# Patient Record
Sex: Male | Born: 1980 | Race: White | Hispanic: No | Marital: Single | Smoking: Current every day smoker
Health system: Southern US, Community
[De-identification: ages and names within clinical notes are randomized; demographics above are authoritative.]

---

## 2003-08-05 ENCOUNTER — Ambulatory Visit (HOSPITAL_COMMUNITY): Admission: RE | Admit: 2003-08-05 | Discharge: 2003-08-05 | Payer: Self-pay | Admitting: Family Medicine

## 2003-08-05 ENCOUNTER — Encounter: Payer: Self-pay | Admitting: Family Medicine

## 2007-03-31 ENCOUNTER — Emergency Department (HOSPITAL_COMMUNITY): Admission: EM | Admit: 2007-03-31 | Discharge: 2007-04-01 | Payer: Self-pay | Admitting: Emergency Medicine

## 2007-06-28 ENCOUNTER — Emergency Department (HOSPITAL_COMMUNITY): Admission: EM | Admit: 2007-06-28 | Discharge: 2007-06-28 | Payer: Self-pay | Admitting: Emergency Medicine

## 2007-11-04 ENCOUNTER — Emergency Department (HOSPITAL_COMMUNITY): Admission: EM | Admit: 2007-11-04 | Discharge: 2007-11-04 | Payer: Self-pay | Admitting: Emergency Medicine

## 2008-12-28 ENCOUNTER — Ambulatory Visit: Payer: Self-pay | Admitting: Cardiology

## 2008-12-28 ENCOUNTER — Ambulatory Visit: Payer: Self-pay | Admitting: Oncology

## 2008-12-28 ENCOUNTER — Inpatient Hospital Stay (HOSPITAL_COMMUNITY): Admission: EM | Admit: 2008-12-28 | Discharge: 2009-01-04 | Payer: Self-pay | Admitting: Internal Medicine

## 2008-12-28 ENCOUNTER — Ambulatory Visit: Payer: Self-pay | Admitting: Emergency Medicine

## 2008-12-29 ENCOUNTER — Encounter: Payer: Self-pay | Admitting: Emergency Medicine

## 2008-12-29 ENCOUNTER — Ambulatory Visit: Payer: Self-pay | Admitting: Infectious Diseases

## 2011-04-02 LAB — GC/CHLAMYDIA PROBE AMP, URINE: Chlamydia, Swab/Urine, PCR: NEGATIVE

## 2011-04-02 LAB — CBC
HCT: 29.2 % — ABNORMAL LOW (ref 39.0–52.0)
HCT: 37.4 % — ABNORMAL LOW (ref 39.0–52.0)
HCT: 29.2 % — ABNORMAL LOW (ref 39.0–52.0)
HCT: 31.3 % — ABNORMAL LOW (ref 39.0–52.0)
Hemoglobin: 10.1 g/dL — ABNORMAL LOW (ref 13.0–17.0)
Hemoglobin: 9.9 g/dL — ABNORMAL LOW (ref 13.0–17.0)
Hemoglobin: 10.9 g/dL — ABNORMAL LOW (ref 13.0–17.0)
Hemoglobin: 9.9 g/dL — ABNORMAL LOW (ref 13.0–17.0)
MCHC: 33.4 g/dL (ref 30.0–36.0)
MCHC: 33.8 g/dL (ref 30.0–36.0)
MCHC: 33.8 g/dL (ref 30.0–36.0)
MCHC: 33.2 g/dL (ref 30.0–36.0)
MCHC: 33.9 g/dL (ref 30.0–36.0)
MCHC: 34.3 g/dL (ref 30.0–36.0)
MCV: 98.1 fL (ref 78.0–100.0)
MCV: 96.7 fL (ref 78.0–100.0)
MCV: 97 fL (ref 78.0–100.0)
MCV: 98.2 fL (ref 78.0–100.0)
MCV: 98.3 fL (ref 78.0–100.0)
Platelets: 20 10*3/uL — CL (ref 150–400)
Platelets: 8 10*3/uL — CL (ref 150–400)
Platelets: 289 10*3/uL (ref 150–400)
RBC: 3.36 MIL/uL — ABNORMAL LOW (ref 4.22–5.81)
RBC: 2.97 MIL/uL — ABNORMAL LOW (ref 4.22–5.81)
RBC: 3.23 MIL/uL — ABNORMAL LOW (ref 4.22–5.81)
RBC: 3.34 MIL/uL — ABNORMAL LOW (ref 4.22–5.81)
RDW: 12.7 % (ref 11.5–15.5)
RDW: 13 % (ref 11.5–15.5)
RDW: 13.2 % (ref 11.5–15.5)
RDW: 13.5 % (ref 11.5–15.5)
RDW: 14.1 % (ref 11.5–15.5)
RDW: 13.7 % (ref 11.5–15.5)
WBC: 10.4 10*3/uL (ref 4.0–10.5)
WBC: 12.9 10*3/uL — ABNORMAL HIGH (ref 4.0–10.5)
WBC: 14.1 10*3/uL — ABNORMAL HIGH (ref 4.0–10.5)
WBC: 15.1 10*3/uL — ABNORMAL HIGH (ref 4.0–10.5)

## 2011-04-02 LAB — GLUCOSE, CAPILLARY
Glucose-Capillary: 107 mg/dL — ABNORMAL HIGH (ref 70–99)
Glucose-Capillary: 130 mg/dL — ABNORMAL HIGH (ref 70–99)
Glucose-Capillary: 161 mg/dL — ABNORMAL HIGH (ref 70–99)
Glucose-Capillary: 80 mg/dL (ref 70–99)
Glucose-Capillary: 87 mg/dL (ref 70–99)
Glucose-Capillary: 89 mg/dL (ref 70–99)

## 2011-04-02 LAB — DIFFERENTIAL
Basophils Relative: 0 % (ref 0–1)
Eosinophils Relative: 0 % (ref 0–5)
Lymphocytes Relative: 1 % — ABNORMAL LOW (ref 12–46)
Lymphocytes Relative: 9 % — ABNORMAL LOW (ref 12–46)
Lymphs Abs: 1.7 10*3/uL (ref 0.7–4.0)
Monocytes Relative: 8 % (ref 3–12)
Neutro Abs: 12 10*3/uL — ABNORMAL HIGH (ref 1.7–7.7)
Neutro Abs: 15.5 10*3/uL — ABNORMAL HIGH (ref 1.7–7.7)
Neutrophils Relative %: 93 % — ABNORMAL HIGH (ref 43–77)
Neutrophils Relative %: 83 % — ABNORMAL HIGH (ref 43–77)

## 2011-04-02 LAB — CARDIAC PANEL(CRET KIN+CKTOT+MB+TROPI)
CK, MB: 2.7 ng/mL (ref 0.3–4.0)
CK, MB: 4.9 ng/mL — ABNORMAL HIGH (ref 0.3–4.0)
CK, MB: 5.5 ng/mL — ABNORMAL HIGH (ref 0.3–4.0)
Relative Index: 1.7 (ref 0.0–2.5)
Total CK: 168 U/L (ref 7–232)
Total CK: 299 U/L — ABNORMAL HIGH (ref 7–232)
Total CK: 321 U/L — ABNORMAL HIGH (ref 7–232)
Troponin I: 0.18 ng/mL — ABNORMAL HIGH (ref 0.00–0.06)

## 2011-04-02 LAB — COMPREHENSIVE METABOLIC PANEL
ALT: 16 U/L (ref 0–53)
ALT: 56 U/L — ABNORMAL HIGH (ref 0–53)
ALT: 103 U/L — ABNORMAL HIGH (ref 0–53)
ALT: 61 U/L — ABNORMAL HIGH (ref 0–53)
AST: 28 U/L (ref 0–37)
Albumin: 2.7 g/dL — ABNORMAL LOW (ref 3.5–5.2)
Alkaline Phosphatase: 56 U/L (ref 39–117)
BUN: 21 mg/dL (ref 6–23)
BUN: 6 mg/dL (ref 6–23)
CO2: 25 mEq/L (ref 19–32)
CO2: 26 mEq/L (ref 19–32)
Calcium: 7.9 mg/dL — ABNORMAL LOW (ref 8.4–10.5)
Calcium: 7.9 mg/dL — ABNORMAL LOW (ref 8.4–10.5)
Calcium: 7.4 mg/dL — ABNORMAL LOW (ref 8.4–10.5)
Calcium: 7.4 mg/dL — ABNORMAL LOW (ref 8.4–10.5)
Chloride: 102 mEq/L (ref 96–112)
Chloride: 106 mEq/L (ref 96–112)
Creatinine, Ser: 1.16 mg/dL (ref 0.4–1.5)
Creatinine, Ser: 0.78 mg/dL (ref 0.4–1.5)
Creatinine, Ser: 0.8 mg/dL (ref 0.4–1.5)
GFR calc Af Amer: >60 mL/min (ref >60)
GFR calc Af Amer: >60 mL/min (ref >60)
GFR calc non Af Amer: >60 mL/min (ref >60)
GFR calc non Af Amer: >60 mL/min (ref >60)
Glucose, Bld: 101 mg/dL — ABNORMAL HIGH (ref 70–99)
Glucose, Bld: 127 mg/dL — ABNORMAL HIGH (ref 70–99)
Glucose, Bld: 120 mg/dL — ABNORMAL HIGH (ref 70–99)
Glucose, Bld: 133 mg/dL — ABNORMAL HIGH (ref 70–99)
Potassium: 3.3 mEq/L — ABNORMAL LOW (ref 3.5–5.1)
Sodium: 134 mEq/L — ABNORMAL LOW (ref 135–145)
Sodium: 144 mEq/L (ref 135–145)
Sodium: 135 mEq/L (ref 135–145)
Sodium: 138 mEq/L (ref 135–145)
Total Bilirubin: 2.1 mg/dL — ABNORMAL HIGH (ref 0.3–1.2)
Total Protein: 4.3 g/dL — ABNORMAL LOW (ref 6.0–8.3)
Total Protein: 5.2 g/dL — ABNORMAL LOW (ref 6.0–8.3)
Total Protein: 5.6 g/dL — ABNORMAL LOW (ref 6.0–8.3)
Total Protein: 4.6 g/dL — ABNORMAL LOW (ref 6.0–8.3)

## 2011-04-02 LAB — BLOOD GAS, ARTERIAL
Acid-base deficit: 4.8 mmol/L — ABNORMAL HIGH (ref 0.0–2.0)
Bicarbonate: 19.9 mEq/L — ABNORMAL LOW (ref 20.0–24.0)
Drawn by: 229971
Drawn by: 308601
FIO2: 0.21 %
FIO2: 0.3 %
MECHVT: 500 mL
MECHVT: 500 mL
O2 Saturation: 98.7 %
PEEP: 5 cmH2O
PEEP: 5 cmH2O
PEEP: 5 cmH2O
Patient temperature: 98.6
Patient temperature: 99
RATE: 12 resp/min
RATE: 14 resp/min
TCO2: 18.7 mmol/L (ref 0–100)
pCO2 arterial: 29.3 mmHg — ABNORMAL LOW (ref 35.0–45.0)
pCO2 arterial: 30.5 mmHg — ABNORMAL LOW (ref 35.0–45.0)
pH, Arterial: 7.392 (ref 7.350–7.450)
pH, Arterial: 7.438 (ref 7.350–7.450)
pH, Arterial: 7.462 — ABNORMAL HIGH (ref 7.350–7.450)
pO2, Arterial: 110 mmHg — ABNORMAL HIGH (ref 80.0–100.0)

## 2011-04-02 LAB — BASIC METABOLIC PANEL
BUN: 19 mg/dL (ref 6–23)
BUN: 10 mg/dL (ref 6–23)
BUN: 6 mg/dL (ref 6–23)
CO2: 19 mEq/L (ref 19–32)
CO2: 25 mEq/L (ref 19–32)
Chloride: 105 mEq/L (ref 96–112)
Chloride: 107 mEq/L (ref 96–112)
Creatinine, Ser: 0.76 mg/dL (ref 0.4–1.5)
GFR calc Af Amer: >60 mL/min (ref >60)
GFR calc Af Amer: >60 mL/min (ref >60)
GFR calc non Af Amer: >60 mL/min (ref >60)
Glucose, Bld: 93 mg/dL (ref 70–99)
Potassium: 3.6 mEq/L (ref 3.5–5.1)
Potassium: 3.3 mEq/L — ABNORMAL LOW (ref 3.5–5.1)
Potassium: 4.1 mEq/L (ref 3.5–5.1)
Sodium: 142 mEq/L (ref 135–145)
Sodium: 138 mEq/L (ref 135–145)

## 2011-04-02 LAB — HIGH SENSITIVITY CRP: CRP, High Sensitivity: 287 mg/L — ABNORMAL HIGH

## 2011-04-02 LAB — LACTIC ACID, PLASMA: Lactic Acid, Venous: 1.5 mmol/L (ref 0.5–2.2)

## 2011-04-02 LAB — PHOSPHORUS
Phosphorus: 1.4 mg/dL — ABNORMAL LOW (ref 2.3–4.6)
Phosphorus: 4.2 mg/dL (ref 2.3–4.6)
Phosphorus: 3.1 mg/dL (ref 2.3–4.6)

## 2011-04-02 LAB — CHLAMYDIA TRACHOMATIS CULTURE

## 2011-04-02 LAB — TSH: TSH: 0.405 u[IU]/mL (ref 0.350–4.500)

## 2011-04-02 LAB — SAVE SMEAR

## 2011-04-02 LAB — URINALYSIS, ROUTINE W REFLEX MICROSCOPIC
Ketones, ur: 40 mg/dL — AB
Nitrite: NEGATIVE
Protein, ur: 100 mg/dL — AB

## 2011-04-02 LAB — DIC (DISSEMINATED INTRAVASCULAR COAGULATION)PANEL
Fibrinogen: 712 mg/dL — ABNORMAL HIGH (ref 204–475)
INR: 1.1 (ref 0.00–1.49)
Smear Review: NONE SEEN
aPTT: 34 seconds (ref 24–37)

## 2011-04-02 LAB — ANA: Anti Nuclear Antibody(ANA): NEGATIVE

## 2011-04-02 LAB — HAPTOGLOBIN: Haptoglobin: 318 mg/dL — ABNORMAL HIGH (ref 16–200)

## 2011-04-02 LAB — HEPATITIS PANEL, ACUTE
HCV Ab: NEGATIVE
Hep B C IgM: NEGATIVE
Hepatitis B Surface Ag: NEGATIVE

## 2011-04-02 LAB — MAGNESIUM
Magnesium: 1.8 mg/dL (ref 1.5–2.5)
Magnesium: 2.7 mg/dL — ABNORMAL HIGH (ref 1.5–2.5)
Magnesium: 1.6 mg/dL (ref 1.5–2.5)
Magnesium: 1.7 mg/dL (ref 1.5–2.5)

## 2011-04-02 LAB — CULTURE, BLOOD (ROUTINE X 2): Culture: NO GROWTH

## 2011-04-02 LAB — VANCOMYCIN, TROUGH: Vancomycin Tr: 23.7 ug/mL — ABNORMAL HIGH (ref 10.0–20.0)

## 2011-04-02 LAB — CULTURE, BAL-QUANTITATIVE W GRAM STAIN: Colony Count: NO GROWTH

## 2011-04-02 LAB — HCV RNA QUANT

## 2011-04-02 LAB — DIRECT ANTIGLOBULIN TEST (NOT AT ARMC)
DAT, IgG: NEGATIVE
DAT, complement: NEGATIVE

## 2011-04-02 LAB — URINE CULTURE: Special Requests: NEGATIVE

## 2011-04-02 LAB — ANTI-NEUTROPHIL ANTIBODY

## 2011-04-02 LAB — SODIUM, URINE, RANDOM: Sodium, Ur: 42 mEq/L
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
## 2011-05-01 NOTE — H&P (Signed)
Derek Hobbs, Derek Hobbs           ACCOUNT NO.:  [id number]   MEDICAL RECORD NO.:  [id number]          PATIENT TYPE:  INP   LOCATION:  1222                         [facility]ACILITY[facility]  [facility]   PHYSICIAN:  Acey Lav, MD  DATE OF BIRTH:  1981/11/17   DATE OF ADMISSION:  12/28/2008  DATE OF DISCHARGE:                              HISTORY & PHYSICAL   CHIEF COMPLAINT:  Fever, nausea, vomiting, profound confusion and  thrombocytopenia.   HISTORY OF PRESENT ILLNESS:  Mr. Derek Hobbs is a 30 year old Caucasian  gentleman with a past medical history significant for abuse of  prescription narcotics who underwent detox 1 year ago who also may  suffer from depression.  He apparently took 3-4 tablets of oxymorphone  with a friend and ingested them Friday night.  It is not clear whether  he snorted these or ate them.  He apparently has no history of  intravenous injection of drugs.  He additionally apparently chewed some  fentanyl patches and ingested them as well.  Saturday during the day, he  awoke with a fever of 103.5 and had severe nausea with vomiting that  persisted throughout the day without hematemesis.  Nausea and vomiting  persisted throughout the day on Saturday and Sunday.  He then began to  develop lower back pain that was 9-10 in severity and persisted through  to Monday in which he then developed headaches and neck pain and neck  stiffness.  He was brought to [facility] by his wife and  after admission became profoundly confused.  Labs done at [facility] showed him to be in acute renal failure with creatinine there  of 3.4 with BUN of 67.  His CBC showed his white count to be slightly  elevated at 11.5, normal hemoglobin but with a platelet count of 27,000.  At [facility], they were concerned that he might have  meningitis, ITP or possibly TTP.  There was consideration for lumbar  puncture, but this was deferred due to his low platelet count.  Overnight, he had a CT scan of the head which was negative, a CT scan of  the abdomen and pelvis which was negative, and a chest x-ray which was  unrevealing.  He had blood cultures drawn today at noon which are not  growing any organisms to date.  He was given a dose of vancomycin 1 g  and a gram of ceftriaxone at [facility].  They repeated  his labs today, and his creatinine had improved with fluids and came  down to 1.9.  His platelets on recheck this morning were 12,000 in a  blue-capped tube.  Arrangements were made with Dr. Flonnie Overman to have the  patient transferred to [facility] as [facility] lacked  hematology/oncology or neurology consultation services.  Upon arrival at  the ICU at [facility], he was profoundly confused, thrashing about.  His exam was notable for erythema and pain in his right elbow as well as  his PIP of his third digit on the right and his left knee.  He also had  lower back pain but without  obvious deformity or abscess.  The patient  is being admitted to [facility] with consultation from  hematology/oncology and Dr. Mancel Bale as well as CCM from Dr. Coralyn Helling.   PAST MEDICAL HISTORY:  1. History of substance abuse with addiction to prescription      narcotics, although he is not currently on prescription narcotics,      and the narcotics he ingested were those of a friend.  2. History of alcohol abuse in the past but not recently per wife.  3. History of depression.  Wife thinks he may have bipolar disorder,      but he has not been formally diagnosed by anyone.   SURGICAL HISTORY:  None.   SOCIAL HISTORY:  The patient is married, has 71-month-old child.  He  smokes tobacco and recently started abusing prescription narcotics  again.   FAMILY HISTORY:  No history of CNS malignancy.   HOME MEDICATIONS:  None.   MEDICATIONS AT TRANSFER:  Vancomycin, Rocephin, Ativan, Haldol,  morphine.   REVIEW OF  SYSTEMS:  Not obtainable from the patient otherwise that what  is described in history of present illness and reviewed with his wife.   PHYSICAL EXAMINATION:  Temperature was 100.5 rectally with a blood  pressure 118/77 with a pulse of 113, respiratory rate was 33, pulse  oximetry 100% on room air.  GENERAL:  The patient is profoundly confused, thrashing about in the  bed.  HEENT:  Is normocephalic, atraumatic.  Pupils were constricted and  minimally reactive to light.  Sclerae anicteric.  Oropharynx is dry  without erythema or exudate.  NECK:  Did not show any evidence of meningismus with the neck flexion  with prompting from his wife.  CARDIOVASCULAR:  Revealed tachycardia without murmurs, gallops or rubs.  LUNGS:  Clear to auscultation bilaterally without wheezing, rhonchi, or  rales.  ABDOMEN:  Was soft, nondistended, nontender without discernible  tenderness.  Positive bowel sounds.  Examination of the patient's joints revealed them to have erythematous  hot right elbow as well as the third DIP on the right.  Additionally,  his left knee was swollen with an effusion, erythematous and painful.  NEUROLOGICAL EXAM:  He was moving all 4 extremities.  He is answering  questions inappropriately.  He does recognize his wife, when prompting  that he is in the hospital, agrees with this.  Otherwise, he makes  inappropriate remarks, is taking off clothes, and offering to buy me  prescription narcotics when he is comprehensible.   LABORATORY DATA:  Admission labs over at [facility]  showed a metabolic panel with a glucose 161, BUN 67, creatinine 3.4.  Sodium 131, potassium 3.0, chloride 89, bicarb 23, glucose 127, amylase  129, initial anion gap of 20, total bilirubin 1.3, direct 0.3, indirect  1, AST and ALT 27 and 16.  CK of 78, CK-MB 1.7, troponin 3.3, lipase 77.  Urinalysis showed specific gravity 1.025, 100 protein, large amounts of  blood, red blood cells, large  amounts of bacteria, 0-5 casts.  Tox  screen positive for opiates but otherwise negative other drugs.  Influenza swab was negative.  Plasma ammonia level was 14 and normal.  Imaging chest x-ray and abdominal series showed no acute pulmonary or  intra-abdominal process.  CT abdomen and pelvis without contrast showed  no pathology in the abdomen or pelvis.  CT without contrast of the head  done yesterday showed no acute intracranial abnormality and reportedly  CT scan today  showed no intracranial abnormality either.  Repeat labs  done today showed that his renal function improved with his creatinine  coming down to 1.9, BUN down to 44, still with elevated bilirubin of  1.8, alkaline phosphatase now down to 62 from 124.  Repeat platelet  count in a blue-topped tube was 12,000 this morning.  Blood cultures  were drawn and have grown no cultures to date; I just checked the lab  this evening.  No urine cultures were done at the other hospital.   ASSESSMENT AND PLAN:  This is a 30 year old Caucasian with a history of  abuse of prescription narcotics who recently ingested oxymorphone and  fentanyl patches apparently by oral route, possibly with nasal route as  well, who presented with acute onset of fever Saturday with nausea and  vomiting for 2 days followed by back pain with ascending pain into the  neck and head and followed by profound confusion.  He was found on labs  to have profound thrombocytopenia, acute renal failure, but not anemia  initially.   1. Neurological:  Differential diagnosis for the patient's acute      neurological compromise includes CNS infection including meningitis      with pneumococcus meningococcus although less likely with the      patient's living conditions.  He could have an aseptic meningitis      with herpes simplex virus.  Certainly, his diffuse polyarticular      arthritis is concerning for polyarticular septic arthritis, and he      may simply have severe  infection with multiple joint involvement      and be confused from sepsis.  Certainly with low platelets, acute      onset of renal failure, and progressive neurological deterioration,      I am concerned about TTP.  For the present, we are going to cover      him broadly with vancomycin for MRSA, ceftriaxone to give him      coverage for disseminated gonococcal disease as well as      meningococcus and pneumococcus.  The vancomycin will also give [place]      coverage for pneumococcus.  I will give acyclovir 10 mg/kg renally      dosed to cover for possible herpes simplex type 1 encephalitis.  LP      is not going to be safe at this time due to the patient's thrashing      and his low platelets.  If he is sedated and intubated, we could do      an LP after being given some platelets.  We would then send this      for a CSF for cell count, differential, protein, glucose, culture,      herpes simplex type 1 and 2, and VZV PCR.  In the interim, I am      working him up for infection broadly with following his cultures at      [facility].  I am checking GC chlamydia from urine and then      GC from mouth and rectum.  2. Profound thrombocytopenia: Differential includes infection with      DIC, ITP, TTP.  Dr. Mancel Bale kindly agreed to come see the      patient and has examined the patient and was dispensable in his      care.  We are sending blood for stat CBC with peripheral blood      smear, LDH, haptoglobin, reticulocyte  count.  I am also checking      HIV antibodies, RNA, hepatitis C RNA and hepatitis panel.  Will      send ANA, ANKA, and sed rates as well.  The patient currently has      one peripheral IV.  Should we need plasmapheresis, we will insert a      central line.  I have also talked to Dr. Coralyn Helling to assist in      management of the patient.  3. Acute renal failure:  Differential includes prerenal failure with      dehydration given his multiple episodes of nausea  and vomiting      versus TTP, HUS.  We will support him with fluids now.  His      creatinine appears to be headed in right direction at this point in      time.  4. Gastrointestinal:  Nausea, vomiting.  This is likely due to an      infection, possibly meningitis, but he certainly could have a      gastroenteritis as well.  He is not having bloody bowel movements.      I will check his stool for white blood cells and for hemoccult.  5. Cardiovascular:  He had a mildly positive troponin with negative CK      and CK-MB.  I will follow his enzymes and recheck his EKG.  His tox      screen was negative for cocaine and amphetamines at the outside      hospital.  6. Polysubstance abuse:  The patient will need counseling once he gets      through his acute episode.  I will also check his blood for an      alcohol level, as certainly alcohol withdrawal should be considered      as well.  7. Agitation:  I will try to manage the patient's agitation with      Haldol and Ativan if needed.  8. Pain.  I will try to minimize use of narcotics for pain but will      use morphine if needed if Tylenol will not relieve his pain.  9. Prophylaxis:  I am going to place the patient on SCDs at this point      in time.  Defer recommendations with regards to anticoagulation to      hematology/oncology given the patient's low platelets.  10.Code:  The patient is a full code.   ADDENDUM: tHE PATIENT WAS FOUND TO HAVE GRAM POSITIVE COCCI IN CLUSTERS  IN BLOOD CULTURES FROM [facility]. THis patient has a  Staphylococcal BACTEREMIA, with dissemination to multiple joints, I fear  given his severe back pain, and his neurological compromise that he  likely has diskitis, possibly an epidural abscess. I am also very  concerned that the pt may have a parameningeal focus of  infection that is seeding his meninges causing meningitis. I have  discussed with Dr. Craige Cotta and we will plan on intubating the patient so   that we can get MRI of brain and spine to evaluate for diskitis,  epidural abscesses, brain abscess. We may very well need Neurosurgical  intervention here.      Acey Lav, MD  Electronically Signed     CV/MEDQ  D:  12/29/2008  T:  12/29/2008  Job:  [id number]   cc:   Leighton Roach. Truett Perna, M.D.  Fax: [phone]   Coralyn Helling, MD  [address]  [place],  [place] [address]

## 2011-05-04 NOTE — Discharge Summary (Signed)
Derek Hobbs, Derek Hobbs           ACCOUNT NO.:  [id number]   MEDICAL RECORD NO.:  [id number]          PATIENT TYPE:  INP   LOCATION:  1514                         [facility]ACILITY[facility]  [facility]   PHYSICIAN:  Theodosia Paling, MD    DATE OF BIRTH:  05-09-1981   DATE OF ADMISSION:  12/28/2008  DATE OF DISCHARGE:  01/04/2009                               DISCHARGE SUMMARY   DISCHARGE DIAGNOSES:  1. Meningeal toxemia.  2. History of alcohol abuse.  3. History of depression.  4. History of polysubstance abuse.  5. Thrombocytopenia.  6. Acute renal insufficiency.  7. Questionable history of depression.   DISCHARGE MEDICATIONS:  None.   HOSPITAL COURSE:  The following issues were addressed during the  hospitalization.  1. Meningeal toxemia.  The patient initially presented with fever,      headache and he was initially taken to [facility]      by his wife.  Initial workup was performed and later on he was      transferred to [facility].  Over there, his blood culture      grew meningococcus.  The patient received ceftriaxone and he      completed a course of antibiotic.  Infectious disease consultation      was performed and Dr. Daiva Eves evaluated the patient for meningeal      toxemia.  Also hematology consult was performed by Dr. Thornton Papas, who felt that everything is secondary to the infection      rather than a hematologic problem because the patient also had      thrombocytopenia.  2. Thrombocytopenia.  The patient initially had a platelet count of      27,000 on admission to [facility] and had a creatinine of      3.4.  After the patient improved with IV antibiotic throughout the      hospitalization, his platelet count improved.  At the time of      discharge his platelet count was within normal range.  He did not      have any acute bleeding or any complications from thrombocytopenia.      At the time of discharge his platelet count has gone from 8  to 7 to      20 to 28 to 80, and at the time of discharge it is 354,000.  Most      likely it was from myelosuppression or secondary to bacteremia.  3. Acute renal insufficiency.  At the time of presentation, the      patient had a creatinine of 3.2.  At time of discharge the      patient's creatinine is 0.76.  He received IV fluids and he did not      develop any acute complications of any electrolyte or volume      disturbances secondary to the renal failure with gentle hydration.      His kidney function improved.   DISCHARGE MEDICATIONS:  None.   CONSULTATION:  1. Performed by Dr. Thornton Papas on December 29, 2008 of hematology  for thrombocytopenia.  2. Dr. Daiva Eves for infectious disease.   PROCEDURES PERFORMED:  None.   IMAGING PERFORMED:  The patient underwent ultrasound of the liver which  showed no acute intra-abdominal pathology.  Chest x-ray done on December 31, 2008 showing no significant change.  Chest x-ray done on December 30, 2008 showing new left lower lobe airspace disease suggestive  atelectasis.  MRI of the cervical, thoracic and lumbar spine did not  show any acute evidence of cystitis or epidural abscess.  Chest x-ray  performed on December 29, 2008 showed no acute infiltrate.   The patient was admitted on January 16 and admitted into intensive care  unit, critical care service, for fever and delirium and he underwent an  intubation at the time of admission.  However, the patient got extubated  almost the next day on the 16th.  The patient also was complaining of  excessive back pain for which MRI was performed, which was negative for  diskitis or osteomyelitis.  Total time spent in discharge of this  patient 1 hour 15 minutes.  The patient does not have a PCP to which  this summary can be copied.      Theodosia Paling, MD  Electronically Signed     NP/MEDQ  D:  02/10/2009  T:  02/10/2009  Job:  [id number]

## 2011-09-25 LAB — DIFFERENTIAL
Basophils Absolute: 0
Basophils Relative: 0
Eosinophils Absolute: 0.1 — ABNORMAL LOW
Eosinophils Relative: 1
Neutrophils Relative %: 78 — ABNORMAL HIGH

## 2011-09-25 LAB — BASIC METABOLIC PANEL
BUN: 9
Chloride: 108
Creatinine, Ser: 0.94
Glucose, Bld: 98
Potassium: 3.8

## 2011-09-25 LAB — CBC
HCT: 43.2
MCHC: 34
MCV: 98.7
Platelets: 270
RDW: 12.7

## 2011-09-25 LAB — RAPID URINE DRUG SCREEN, HOSP PERFORMED
Amphetamines: NOT DETECTED
Barbiturates: NOT DETECTED

## 2011-10-02 LAB — BASIC METABOLIC PANEL
BUN: 7
CO2: 26
Calcium: 10.2
Glucose, Bld: 107 — ABNORMAL HIGH
Sodium: 141

## 2011-10-02 LAB — RAPID URINE DRUG SCREEN, HOSP PERFORMED
Amphetamines: NOT DETECTED
Barbiturates: NOT DETECTED
Benzodiazepines: NOT DETECTED
Cocaine: NOT DETECTED
Opiates: POSITIVE — AB

## 2011-10-02 LAB — DIFFERENTIAL
Eosinophils Relative: 0
Lymphocytes Relative: 7 — ABNORMAL LOW
Lymphs Abs: 0.9
Monocytes Relative: 6

## 2011-10-02 LAB — CBC
HCT: 46.7
Hemoglobin: 16
Platelets: 242
RBC: 4.9
WBC: 12.5 — ABNORMAL HIGH

## 2015-09-18 ENCOUNTER — Inpatient Hospital Stay (HOSPITAL_COMMUNITY)
Admission: EM | Admit: 2015-09-18 | Discharge: 2015-09-29 | DRG: 987 | Disposition: A | Discharge status: Home / Self Care | Payer: Self-pay | Attending: Surgery | Admitting: Surgery

## 2015-09-18 ENCOUNTER — Emergency Department (HOSPITAL_COMMUNITY): Payer: Self-pay

## 2015-09-18 ENCOUNTER — Emergency Department (HOSPITAL_COMMUNITY): Payer: MEDICAID

## 2015-09-18 ENCOUNTER — Encounter (HOSPITAL_COMMUNITY): Payer: Self-pay

## 2015-09-18 DIAGNOSIS — R4182 Altered mental status, unspecified: Secondary | ICD-10-CM

## 2015-09-18 DIAGNOSIS — T07XXXA Unspecified multiple injuries, initial encounter: Secondary | ICD-10-CM

## 2015-09-18 DIAGNOSIS — S40812A Abrasion of left upper arm, initial encounter: Secondary | ICD-10-CM | POA: Diagnosis present

## 2015-09-18 DIAGNOSIS — J690 Pneumonitis due to inhalation of food and vomit: Secondary | ICD-10-CM | POA: Diagnosis present

## 2015-09-18 DIAGNOSIS — S065X9A Traumatic subdural hemorrhage with loss of consciousness of unspecified duration, initial encounter: Principal | ICD-10-CM | POA: Diagnosis present

## 2015-09-18 DIAGNOSIS — T1490XA Injury, unspecified, initial encounter: Secondary | ICD-10-CM

## 2015-09-18 DIAGNOSIS — S80812A Abrasion, left lower leg, initial encounter: Secondary | ICD-10-CM | POA: Diagnosis present

## 2015-09-18 DIAGNOSIS — W19XXXA Unspecified fall, initial encounter: Secondary | ICD-10-CM

## 2015-09-18 DIAGNOSIS — S01511A Laceration without foreign body of lip, initial encounter: Secondary | ICD-10-CM | POA: Diagnosis present

## 2015-09-18 DIAGNOSIS — J9690 Respiratory failure, unspecified, unspecified whether with hypoxia or hypercapnia: Secondary | ICD-10-CM

## 2015-09-18 DIAGNOSIS — E876 Hypokalemia: Secondary | ICD-10-CM | POA: Diagnosis not present

## 2015-09-18 DIAGNOSIS — R310 Gross hematuria: Secondary | ICD-10-CM | POA: Diagnosis not present

## 2015-09-18 DIAGNOSIS — S022XXA Fracture of nasal bones, initial encounter for closed fracture: Secondary | ICD-10-CM | POA: Diagnosis present

## 2015-09-18 DIAGNOSIS — S80811A Abrasion, right lower leg, initial encounter: Secondary | ICD-10-CM | POA: Diagnosis present

## 2015-09-18 DIAGNOSIS — T17908A Unspecified foreign body in respiratory tract, part unspecified causing other injury, initial encounter: Secondary | ICD-10-CM

## 2015-09-18 DIAGNOSIS — Z6822 Body mass index (BMI) 22.0-22.9, adult: Secondary | ICD-10-CM

## 2015-09-18 DIAGNOSIS — J189 Pneumonia, unspecified organism: Secondary | ICD-10-CM

## 2015-09-18 DIAGNOSIS — S065XAA Traumatic subdural hemorrhage with loss of consciousness status unknown, initial encounter: Secondary | ICD-10-CM

## 2015-09-18 DIAGNOSIS — S40811A Abrasion of right upper arm, initial encounter: Secondary | ICD-10-CM | POA: Diagnosis present

## 2015-09-18 DIAGNOSIS — R402432 Glasgow coma scale score 3-8, at arrival to emergency department: Secondary | ICD-10-CM | POA: Diagnosis present

## 2015-09-18 DIAGNOSIS — J9811 Atelectasis: Secondary | ICD-10-CM | POA: Diagnosis present

## 2015-09-18 DIAGNOSIS — S069X9A Unspecified intracranial injury with loss of consciousness of unspecified duration, initial encounter: Secondary | ICD-10-CM | POA: Diagnosis present

## 2015-09-18 DIAGNOSIS — D62 Acute posthemorrhagic anemia: Secondary | ICD-10-CM | POA: Diagnosis present

## 2015-09-18 DIAGNOSIS — S0101XA Laceration without foreign body of scalp, initial encounter: Secondary | ICD-10-CM | POA: Diagnosis present

## 2015-09-18 DIAGNOSIS — R000 Tachycardia, unspecified: Secondary | ICD-10-CM | POA: Diagnosis present

## 2015-09-18 DIAGNOSIS — N390 Urinary tract infection, site not specified: Secondary | ICD-10-CM | POA: Diagnosis not present

## 2015-09-18 DIAGNOSIS — S069XAA Unspecified intracranial injury with loss of consciousness status unknown, initial encounter: Secondary | ICD-10-CM | POA: Diagnosis present

## 2015-09-18 DIAGNOSIS — J982 Interstitial emphysema: Secondary | ICD-10-CM | POA: Diagnosis present

## 2015-09-18 DIAGNOSIS — F1910 Other psychoactive substance abuse, uncomplicated: Secondary | ICD-10-CM | POA: Diagnosis present

## 2015-09-18 LAB — CBC WITH DIFFERENTIAL/PLATELET
Basophils Absolute: 0 10*3/uL (ref 0.0–0.1)
Basophils Relative: 0 %
EOS ABS: 0 10*3/uL (ref 0.0–0.7)
Eosinophils Relative: 0 %
HCT: 40.7 % (ref 39.0–52.0)
HEMOGLOBIN: 14.1 g/dL (ref 13.0–17.0)
LYMPHS ABS: 1.7 10*3/uL (ref 0.7–4.0)
Lymphocytes Relative: 13 %
MCH: 31.2 pg (ref 26.0–34.0)
MCHC: 34.6 g/dL (ref 30.0–36.0)
MCV: 90 fL (ref 78.0–100.0)
MONOS PCT: 7 %
Monocytes Absolute: 1 10*3/uL (ref 0.1–1.0)
NEUTROS PCT: 80 %
Neutro Abs: 10.6 10*3/uL — ABNORMAL HIGH (ref 1.7–7.7)
Platelets: 266 10*3/uL (ref 150–400)
RBC: 4.52 MIL/uL (ref 4.22–5.81)
RDW: 12.4 % (ref 11.5–15.5)
WBC: 13.3 10*3/uL — ABNORMAL HIGH (ref 4.0–10.5)

## 2015-09-18 LAB — I-STAT CHEM 8, ED
BUN: 37 mg/dL — ABNORMAL HIGH (ref 6–20)
CALCIUM ION: 1.12 mmol/L (ref 1.12–1.23)
Chloride: 105 mmol/L (ref 101–111)
Creatinine, Ser: 1 mg/dL (ref 0.61–1.24)
GLUCOSE: 129 mg/dL — AB (ref 65–99)
HCT: 46 % (ref 39.0–52.0)
HEMOGLOBIN: 15.6 g/dL (ref 13.0–17.0)
Potassium: 3.6 mmol/L (ref 3.5–5.1)
Sodium: 144 mmol/L (ref 135–145)
TCO2: 22 mmol/L (ref 0–100)

## 2015-09-18 LAB — RAPID URINE DRUG SCREEN, HOSP PERFORMED
AMPHETAMINES: POSITIVE — AB
BARBITURATES: NOT DETECTED
BENZODIAZEPINES: POSITIVE — AB
COCAINE: POSITIVE — AB
Opiates: POSITIVE — AB
TETRAHYDROCANNABINOL: NOT DETECTED

## 2015-09-18 LAB — URINE MICROSCOPIC-ADD ON

## 2015-09-18 LAB — COMPREHENSIVE METABOLIC PANEL
ALBUMIN: 3.5 g/dL (ref 3.5–5.0)
ALK PHOS: 72 U/L (ref 38–126)
ALT: 77 U/L — ABNORMAL HIGH (ref 17–63)
ANION GAP: 19 — AB (ref 5–15)
AST: 130 U/L — ABNORMAL HIGH (ref 15–41)
BUN: 26 mg/dL — ABNORMAL HIGH (ref 6–20)
CO2: 21 mmol/L — AB (ref 22–32)
Calcium: 9.3 mg/dL (ref 8.9–10.3)
Chloride: 105 mmol/L (ref 101–111)
Creatinine, Ser: 1.17 mg/dL (ref 0.61–1.24)
GFR calc Af Amer: >60 mL/min (ref >60)
GFR calc non Af Amer: >60 mL/min (ref >60)
GLUCOSE: 116 mg/dL — AB (ref 65–99)
POTASSIUM: 3.7 mmol/L (ref 3.5–5.1)
SODIUM: 145 mmol/L (ref 135–145)
Total Bilirubin: 1.4 mg/dL — ABNORMAL HIGH (ref 0.3–1.2)
Total Protein: 6.4 g/dL — ABNORMAL LOW (ref 6.5–8.1)

## 2015-09-18 LAB — I-STAT CG4 LACTIC ACID, ED: LACTIC ACID, VENOUS: 8.74 mmol/L — AB (ref 0.5–2.0)

## 2015-09-18 LAB — PREPARE FRESH FROZEN PLASMA
UNIT DIVISION: 0
Unit division: 0

## 2015-09-18 LAB — I-STAT ARTERIAL BLOOD GAS, ED
ACID-BASE DEFICIT: 3 mmol/L — AB (ref 0.0–2.0)
BICARBONATE: 24.7 meq/L — AB (ref 20.0–24.0)
O2 Saturation: 100 %
PCO2 ART: 58.5 mmHg — AB (ref 35.0–45.0)
PO2 ART: 377 mmHg — AB (ref 80.0–100.0)
Patient temperature: 98.6
TCO2: 26 mmol/L (ref 0–100)
pH, Arterial: 7.233 — ABNORMAL LOW (ref 7.350–7.450)

## 2015-09-18 LAB — TRIGLYCERIDES: Triglycerides: 227 mg/dL — ABNORMAL HIGH (ref <150)

## 2015-09-18 LAB — URINALYSIS, ROUTINE W REFLEX MICROSCOPIC
BILIRUBIN URINE: NEGATIVE
Glucose, UA: 500 mg/dL — AB
Ketones, ur: NEGATIVE mg/dL
Leukocytes, UA: NEGATIVE
NITRITE: NEGATIVE
PH: 5.5 (ref 5.0–8.0)
Protein, ur: 100 mg/dL — AB
Urobilinogen, UA: 0.2 mg/dL (ref 0.0–1.0)

## 2015-09-18 LAB — ABO/RH: ABO/RH(D): O POS

## 2015-09-18 LAB — BLOOD GAS, ARTERIAL
ACID-BASE DEFICIT: 3.1 mmol/L — AB (ref 0.0–2.0)
Bicarbonate: 21.5 mEq/L (ref 20.0–24.0)
Drawn by: 441351
FIO2: 0.4
LHR: 20 {breaths}/min
MECHVT: 580 mL
O2 Saturation: 94.5 %
PATIENT TEMPERATURE: 96.8
PCO2 ART: 37.4 mmHg (ref 35.0–45.0)
PEEP/CPAP: 5 cmH2O
PO2 ART: 74.8 mmHg — AB (ref 80.0–100.0)
TCO2: 22.7 mmol/L (ref 0–100)
pH, Arterial: 7.371 (ref 7.350–7.450)

## 2015-09-18 LAB — CK: CK TOTAL: 3284 U/L — AB (ref 49–397)

## 2015-09-18 LAB — MRSA PCR SCREENING: MRSA BY PCR: NEGATIVE

## 2015-09-18 LAB — ETHANOL: Alcohol, Ethyl (B): <5 mg/dL (ref <5)

## 2015-09-18 MED ORDER — SODIUM CHLORIDE 0.9 % IV SOLN
25.0000 ug/h | INTRAVENOUS | Status: DC
  Administered 2015-09-18: 50 ug/h via INTRAVENOUS
  Administered 2015-09-19 (×2): 350 ug/h via INTRAVENOUS
  Administered 2015-09-19: 300 ug/h via INTRAVENOUS
  Administered 2015-09-20 (×2): 150 ug/h via INTRAVENOUS
  Administered 2015-09-22: 75 ug/h via INTRAVENOUS
  Filled 2015-09-18 (×7): qty 50

## 2015-09-18 MED ORDER — ONDANSETRON HCL 4 MG PO TABS: 4.0000 mg | ORAL_TABLET | Freq: Four times a day (QID) | ORAL | Status: DC | PRN

## 2015-09-18 MED ORDER — ETOMIDATE 2 MG/ML IV SOLN
INTRAVENOUS | Status: AC
  Filled 2015-09-18: qty 20

## 2015-09-18 MED ORDER — ANTISEPTIC ORAL RINSE SOLUTION (CORINZ)
7.0000 mL | OROMUCOSAL | Status: DC
  Administered 2015-09-19 (×9): 7 mL via OROMUCOSAL

## 2015-09-18 MED ORDER — HYDROCORTISONE 1 % EX CREA
1.0000 "application " | TOPICAL_CREAM | Freq: Two times a day (BID) | CUTANEOUS | Status: DC
  Administered 2015-09-19: 1 via TOPICAL
  Filled 2015-09-18 (×2): qty 28

## 2015-09-18 MED ORDER — SODIUM CHLORIDE 0.9 % IV SOLN
3.0000 g | Freq: Four times a day (QID) | INTRAVENOUS | Status: DC
  Administered 2015-09-18 – 2015-09-26 (×31): 3 g via INTRAVENOUS
  Filled 2015-09-18 (×34): qty 3

## 2015-09-18 MED ORDER — POTASSIUM CHLORIDE IN NACL 20-0.9 MEQ/L-% IV SOLN
INTRAVENOUS | Status: DC
  Administered 2015-09-18: 1000 mL via INTRAVENOUS
  Administered 2015-09-19: 12:00:00 via INTRAVENOUS
  Administered 2015-09-19: 1000 mL via INTRAVENOUS
  Administered 2015-09-20 (×4): via INTRAVENOUS
  Administered 2015-09-21: 150 mL/h via INTRAVENOUS
  Administered 2015-09-22 (×2): via INTRAVENOUS
  Filled 2015-09-18 (×17): qty 1000

## 2015-09-18 MED ORDER — FENTANYL BOLUS VIA INFUSION
50.0000 ug | INTRAVENOUS | Status: DC | PRN
  Filled 2015-09-18: qty 50

## 2015-09-18 MED ORDER — ETOMIDATE 2 MG/ML IV SOLN
INTRAVENOUS | Status: AC | PRN
  Administered 2015-09-18: 20 mg via INTRAVENOUS

## 2015-09-18 MED ORDER — PROPOFOL 1000 MG/100ML IV EMUL
0.0000 ug/kg/min | INTRAVENOUS | Status: DC
  Administered 2015-09-19 (×2): 50 ug/kg/min via INTRAVENOUS
  Administered 2015-09-19: 40 ug/kg/min via INTRAVENOUS
  Administered 2015-09-20: 20 ug/kg/min via INTRAVENOUS
  Administered 2015-09-20: 5 ug/kg/min via INTRAVENOUS
  Administered 2015-09-21 (×2): 30 ug/kg/min via INTRAVENOUS
  Filled 2015-09-18 (×8): qty 100

## 2015-09-18 MED ORDER — ONDANSETRON HCL 4 MG/2ML IJ SOLN: 4.0000 mg | Freq: Four times a day (QID) | INTRAMUSCULAR | Status: DC | PRN

## 2015-09-18 MED ORDER — SUCCINYLCHOLINE CHLORIDE 20 MG/ML IJ SOLN
INTRAMUSCULAR | Status: AC
  Filled 2015-09-18: qty 1

## 2015-09-18 MED ORDER — ROCURONIUM BROMIDE 50 MG/5ML IV SOLN
INTRAVENOUS | Status: AC
  Filled 2015-09-18: qty 2

## 2015-09-18 MED ORDER — FENTANYL CITRATE (PF) 100 MCG/2ML IJ SOLN
50.0000 ug | Freq: Once | INTRAMUSCULAR | Status: AC
  Administered 2015-09-18: 50 ug via INTRAVENOUS
  Filled 2015-09-18: qty 2

## 2015-09-18 MED ORDER — SODIUM CHLORIDE 0.9 % IV BOLUS (SEPSIS)
1000.0000 mL | Freq: Once | INTRAVENOUS | Status: AC
  Administered 2015-09-18: 1000 mL via INTRAVENOUS

## 2015-09-18 MED ORDER — IOHEXOL 300 MG/ML  SOLN: 100.0000 mL | Freq: Once | INTRAMUSCULAR | Status: DC | PRN

## 2015-09-18 MED ORDER — PROPOFOL 1000 MG/100ML IV EMUL
INTRAVENOUS | Status: AC
  Administered 2015-09-18: 5 ug/kg/min
  Filled 2015-09-18: qty 100

## 2015-09-18 MED ORDER — ROCURONIUM BROMIDE 50 MG/5ML IV SOLN
INTRAVENOUS | Status: AC | PRN
  Administered 2015-09-18: 100 mg via INTRAVENOUS

## 2015-09-18 MED ORDER — CHLORHEXIDINE GLUCONATE 0.12% ORAL RINSE (MEDLINE KIT)
15.0000 mL | Freq: Two times a day (BID) | OROMUCOSAL | Status: DC
  Administered 2015-09-19 – 2015-09-28 (×18): 15 mL via OROMUCOSAL

## 2015-09-18 MED ORDER — LIDOCAINE HCL (CARDIAC) 20 MG/ML IV SOLN
INTRAVENOUS | Status: AC
  Filled 2015-09-18: qty 5

## 2015-09-18 MED ORDER — SODIUM CHLORIDE 0.9 % IV SOLN
INTRAVENOUS | Status: AC | PRN
  Administered 2015-09-18: 1000 mL via INTRAVENOUS

## 2015-09-18 NOTE — Consult Note (Signed)
  34 y/o male brought to [facility] after he was found lying on [place] [address]  Note dictated

## 2015-09-18 NOTE — ED Notes (Signed)
Pt having intermittent runs of bigeminy lasting approx 30 seconds, dr pickering aware.

## 2015-09-18 NOTE — ED Notes (Signed)
Pt has laceration noted through entire upper lip noted.
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
## 2015-09-19 ENCOUNTER — Inpatient Hospital Stay (HOSPITAL_COMMUNITY): Payer: Self-pay

## 2015-09-19 LAB — BLOOD GAS, ARTERIAL
Acid-base deficit: 2.6 mmol/L — ABNORMAL HIGH (ref 0.0–2.0)
Bicarbonate: 21.5 mEq/L (ref 20.0–24.0)
DRAWN BY: 33176
FIO2: 0.4
MECHVT: 580 mL
O2 Saturation: 96.1 %
PEEP/CPAP: 5 cmH2O
PH ART: 7.393 (ref 7.350–7.450)
PO2 ART: 85.7 mmHg (ref 80.0–100.0)
Patient temperature: 99.1
RATE: 20 resp/min
TCO2: 22.6 mmol/L (ref 0–100)
pCO2 arterial: 36.3 mmHg (ref 35.0–45.0)

## 2015-09-19 LAB — TYPE AND SCREEN
ABO/RH(D): O POS
Antibody Screen: NEGATIVE
UNIT DIVISION: 0
UNIT DIVISION: 0

## 2015-09-19 LAB — BASIC METABOLIC PANEL
Anion gap: 12 (ref 5–15)
BUN: 24 mg/dL — AB (ref 6–20)
CALCIUM: 7.7 mg/dL — AB (ref 8.9–10.3)
CO2: 23 mmol/L (ref 22–32)
CREATININE: 0.92 mg/dL (ref 0.61–1.24)
Chloride: 111 mmol/L (ref 101–111)
GFR calc non Af Amer: >60 mL/min (ref >60)
GLUCOSE: 89 mg/dL (ref 65–99)
Potassium: 4.8 mmol/L (ref 3.5–5.1)
Sodium: 146 mmol/L — ABNORMAL HIGH (ref 135–145)

## 2015-09-19 LAB — CBC
HEMATOCRIT: 32.9 % — AB (ref 39.0–52.0)
HEMOGLOBIN: 11.6 g/dL — AB (ref 13.0–17.0)
MCH: 31.8 pg (ref 26.0–34.0)
MCHC: 35.3 g/dL (ref 30.0–36.0)
MCV: 90.1 fL (ref 78.0–100.0)
Platelets: 206 10*3/uL (ref 150–400)
RBC: 3.65 MIL/uL — ABNORMAL LOW (ref 4.22–5.81)
RDW: 12.9 % (ref 11.5–15.5)
WBC: 12.6 10*3/uL — AB (ref 4.0–10.5)

## 2015-09-19 LAB — LACTIC ACID, PLASMA: Lactic Acid, Venous: 5.2 mmol/L (ref 0.5–2.0)

## 2015-09-19 MED ORDER — HYDROMORPHONE HCL 1 MG/ML IJ SOLN
1.0000 mg | INTRAMUSCULAR | Status: DC | PRN
  Administered 2015-09-19 – 2015-09-26 (×9): 1 mg via INTRAVENOUS
  Filled 2015-09-19 (×9): qty 1

## 2015-09-19 MED ORDER — PANTOPRAZOLE SODIUM 40 MG IV SOLR
40.0000 mg | Freq: Every day | INTRAVENOUS | Status: DC
  Administered 2015-09-19 – 2015-09-22 (×4): 40 mg via INTRAVENOUS
  Filled 2015-09-19 (×4): qty 40

## 2015-09-19 MED ORDER — ANTISEPTIC ORAL RINSE SOLUTION (CORINZ)
7.0000 mL | Freq: Four times a day (QID) | OROMUCOSAL | Status: DC
  Administered 2015-09-19 – 2015-09-29 (×34): 7 mL via OROMUCOSAL

## 2015-09-19 MED ORDER — BACITRACIN ZINC 500 UNIT/GM EX OINT
TOPICAL_OINTMENT | Freq: Two times a day (BID) | CUTANEOUS | Status: DC
  Administered 2015-09-19 – 2015-09-23 (×8): 15.5556 via TOPICAL
  Administered 2015-09-23: 1 via TOPICAL
  Administered 2015-09-24 – 2015-09-25 (×3): 15.5556 via TOPICAL
  Administered 2015-09-25: 22:00:00 via TOPICAL
  Administered 2015-09-26: 1 via TOPICAL
  Administered 2015-09-26 – 2015-09-28 (×4): via TOPICAL
  Filled 2015-09-19 (×2): qty 28.35

## 2015-09-19 MED ORDER — LIDOCAINE-EPINEPHRINE (PF) 1 %-1:200000 IJ SOLN
10.0000 mL | Freq: Once | INTRAMUSCULAR | Status: AC
  Administered 2015-09-19: 10 mL via INTRADERMAL
  Filled 2015-09-19: qty 10

## 2015-09-19 MED ORDER — SODIUM CHLORIDE 0.9 % IV BOLUS (SEPSIS)
1000.0000 mL | Freq: Once | INTRAVENOUS | Status: AC
  Administered 2015-09-19: 1000 mL via INTRAVENOUS

## 2015-09-19 MED ORDER — ACETAMINOPHEN 650 MG RE SUPP
650.0000 mg | Freq: Four times a day (QID) | RECTAL | Status: DC | PRN
  Administered 2015-09-28: 650 mg via RECTAL
  Filled 2015-09-19: qty 1

## 2015-09-19 MED ORDER — MIDAZOLAM HCL 2 MG/2ML IJ SOLN
2.0000 mg | INTRAMUSCULAR | Status: DC | PRN
  Administered 2015-09-19: 2 mg via INTRAVENOUS
  Administered 2015-09-21 – 2015-09-22 (×3): 4 mg via INTRAVENOUS
  Administered 2015-09-25 (×2): 2 mg via INTRAVENOUS
  Administered 2015-09-25: 4 mg via INTRAVENOUS
  Filled 2015-09-19 (×2): qty 4
  Filled 2015-09-19: qty 2
  Filled 2015-09-19 (×3): qty 4

## 2015-09-19 MED ORDER — ANTISEPTIC ORAL RINSE SOLUTION (CORINZ): 7.0000 mL | Freq: Every day | OROMUCOSAL | Status: DC
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
## 2015-09-19 NOTE — Progress Notes (Signed)
Patient ID: Derek Hobbs, male   DOB: 1981/02/07, 34 y.o.   MRN: [id number] Intubated. Sedated. Ct head  Stable subdural hematoma, ?right temporal lobe contusion. Continue conservative treatment

## 2015-09-19 NOTE — Progress Notes (Signed)
Lactic Acid level 5.2 results called to Dr. Magnus Ivan

## 2015-09-19 NOTE — Progress Notes (Signed)
Follow up - Trauma and Critical Care  Patient Details:    Derek Hobbs is an 34 y.o. male.  Lines/tubes : Airway 7.5 mm (Active)  Secured at (cm) 24 cm 09/19/2015  7:45 AM  Measured From Lips 09/19/2015  7:45 AM  Secured Location Right 09/19/2015  7:45 AM  Secured By [employer] 09/19/2015  7:45 AM  Tube Holder Repositioned Yes 09/19/2015  7:45 AM  Cuff Pressure (cm H2O) 28 cm H2O 09/19/2015 12:00 AM  Site Condition Dry 09/19/2015  7:45 AM     NG/OG Tube Orogastric 18 Fr. Center mouth (Active)  Placement Verification Auscultation 09/18/2015 10:00 PM  Site Assessment Clean;Dry;Intact 09/18/2015 10:00 PM  Status Suction-low intermittent 09/18/2015 10:00 PM  Amount of suction 75 mmHg 09/18/2015 10:00 PM  Drainage Appearance Brown 09/18/2015 10:00 PM  Output (mL) 410 mL 09/19/2015  4:00 AM     Urethral Catheter k cane nt Temperature probe 16 Fr. (Active)  Indication for Insertion or Continuance of Catheter Unstable critical patients (first 24-48 hours) 09/18/2015 10:00 PM  Site Assessment Clean;Intact 09/18/2015 10:00 PM  Catheter Maintenance Bag below level of bladder;Catheter secured;Drainage bag/tubing not touching floor;Insertion date on drainage bag;No dependent loops;Seal intact;Bag emptied prior to transport 09/18/2015 10:00 PM  Collection Container Standard drainage bag 09/18/2015 10:00 PM  Securement Method Securing device (Describe) 09/18/2015 10:00 PM  Urinary Catheter Interventions Unclamped 09/18/2015 10:00 PM  Output (mL) 100 mL 09/19/2015  6:00 AM    Microbiology/Sepsis markers: Results for orders placed or performed during the hospital encounter of 09/18/15  MRSA PCR Screening     Status: None   Collection Time: 09/18/15 10:29 PM  Result Value Ref Range Status   MRSA by PCR NEGATIVE NEGATIVE Final    Comment:        The GeneXpert MRSA Assay (FDA approved for NASAL specimens only), is one component of a comprehensive MRSA colonization surveillance program. It  is not intended to diagnose MRSA infection nor to guide or monitor treatment for MRSA infections.     Anti-infectives:  Anti-infectives    Start     Dose/Rate Route Frequency Ordered Stop   09/18/15 2200  Ampicillin-Sulbactam (UNASYN) 3 g in sodium chloride 0.9 % 100 mL IVPB     3 g 100 mL/hr over 60 Minutes Intravenous Every 6 hours 09/18/15 2103        Best Practice/Protocols:  VTE Prophylaxis: Mechanical GI Prophylaxis: Proton Pump Inhibitor Continous Sedation  Consults: Treatment Team:  Hilda Lias, MD    Events:  Subjective:    Overnight Issues: Patient still not very responsive.  Has a cut on his lip which is fairly complex, nasal fracture  Objective:  Vital signs for last 24 hours: Temp:  [94.8 F (34.9 C)-99.7 F (37.6 C)] 99.1 F (37.3 C) (10/03 0715) Pulse Rate:  [95-127] 114 (10/03 0745) Resp:  [16-39] 24 (10/03 0745) BP: (101-167)/(52-98) 102/52 mmHg (10/03 0745) SpO2:  [96 %-100 %] 100 % (10/03 0745) FiO2 (%):  [40 %-100 %] 40 % (10/03 0745) Weight:  [72.576 kg (160 lb)] 72.576 kg (160 lb) (10/02 1952)  Hemodynamic parameters for last 24 hours:    Intake/Output from previous day: 10/02 0701 - 10/03 0700 In: 4987 [I.V.:3787; IV Piggyback:1200] Out: 815 [Urine:405; Emesis/NG output:410]  Intake/Output this shift:    Vent settings for last 24 hours: Vent Mode:  [-] PRVC FiO2 (%):  [40 %-100 %] 40 % Set Rate:  [18 bmp-20 bmp] 20 bmp Vt Set:  [580 mL]  580 mL PEEP:  [5 cmH20] 5 cmH20 Plateau Pressure:  [14 cmH20-29 cmH20] 14 cmH20  Physical Exam:  General: no respiratory distress Neuro: RASS 0 Resp: clear to auscultation bilaterally and CXR shows pneumonia CVS: regular rate and rhythm, S1, S2 normal, no murmur, click, rub or gallop GI: soft, nontender, BS WNL, no r/g Extremities: no edema, no erythema, pulses WNL  Results for orders placed or performed during the hospital encounter of 09/18/15 (from the past 24 hour(s))  Prepare  fresh frozen plasma     Status: None   Collection Time: 09/18/15  7:37 PM  Result Value Ref Range   Unit Number [id number]    Blood Component Type LIQ PLASMA    Unit division 00    Status of Unit REL FROM [facility]    Unit tag comment VERBAL ORDERS PER DR PICKERING    Transfusion Status OK TO TRANSFUSE    Unit Number [id number]    Blood Component Type LIQ PLASMA    Unit division 00    Status of Unit REL FROM [facility]    Unit tag comment VERBAL ORDERS PER DR PICKERING    Transfusion Status OK TO TRANSFUSE   I-stat Chem 8, ED     Status: Abnormal   Collection Time: 09/18/15  7:46 PM  Result Value Ref Range   Sodium 144 135 - 145 mmol/L   Potassium 3.6 3.5 - 5.1 mmol/L   Chloride 105 101 - 111 mmol/L   BUN 37 (H) 6 - 20 mg/dL   Creatinine, Ser 3.08 0.61 - 1.24 mg/dL   Glucose, Bld 657 (H) 65 - 99 mg/dL   Calcium, Ion 8.46 9.62 - 1.23 mmol/L   TCO2 22 0 - 100 mmol/L   Hemoglobin 15.6 13.0 - 17.0 g/dL   HCT 95.2 84.1 - 32.4 %  I-Stat CG4 Lactic Acid, ED     Status: Abnormal   Collection Time: 09/18/15  7:46 PM  Result Value Ref Range   Lactic Acid, Venous 8.74 (HH) 0.5 - 2.0 mmol/L   Comment NOTIFIED PHYSICIAN   Type and screen     Status: None   Collection Time: 09/18/15  7:50 PM  Result Value Ref Range   ABO/RH(D) O POS    Antibody Screen NEG    Sample Expiration 09/21/2015    Unit Number [id number]    Blood Component Type RBC LR PHER2    Unit division 00    Status of Unit REL FROM [facility]    Unit tag comment VERBAL ORDERS PER DR PICKERING    Transfusion Status OK TO TRANSFUSE    Crossmatch Result PENDING    Unit Number [id number]    Blood Component Type RED CELLS,LR    Unit division 00    Status of Unit REL FROM [facility]    Unit tag comment VERBAL ORDERS PER DR PICKERING    Transfusion Status OK TO TRANSFUSE    Crossmatch Result PENDING   ABO/Rh     Status: None   Collection Time: 09/18/15  7:50 PM  Result Value Ref Range   ABO/RH(D) O POS   Comprehensive  metabolic panel     Status: Abnormal   Collection Time: 09/18/15  7:59 PM  Result Value Ref Range   Sodium 145 135 - 145 mmol/L   Potassium 3.7 3.5 - 5.1 mmol/L   Chloride 105 101 - 111 mmol/L   CO2 21 (L) 22 - 32 mmol/L   Glucose, Bld 116 (H) 65 - 99 mg/dL  BUN 26 (H) 6 - 20 mg/dL   Creatinine, Ser 4.54 0.61 - 1.24 mg/dL   Calcium 9.3 8.9 - 09.8 mg/dL   Total Protein 6.4 (L) 6.5 - 8.1 g/dL   Albumin 3.5 3.5 - 5.0 g/dL   AST 119 (H) 15 - 41 U/L   ALT 77 (H) 17 - 63 U/L   Alkaline Phosphatase 72 38 - 126 U/L   Total Bilirubin 1.4 (H) 0.3 - 1.2 mg/dL   GFR calc non Af Amer >60 >60 mL/min   GFR calc Af Amer >60 >60 mL/min   Anion gap 19 (H) 5 - 15  CBC with Differential     Status: Abnormal   Collection Time: 09/18/15  7:59 PM  Result Value Ref Range   WBC 13.3 (H) 4.0 - 10.5 K/uL   RBC 4.52 4.22 - 5.81 MIL/uL   Hemoglobin 14.1 13.0 - 17.0 g/dL   HCT 14.7 82.9 - 56.2 %   MCV 90.0 78.0 - 100.0 fL   MCH 31.2 26.0 - 34.0 pg   MCHC 34.6 30.0 - 36.0 g/dL   RDW 13.0 86.5 - 78.4 %   Platelets 266 150 - 400 K/uL   Neutrophils Relative % 80 %   Neutro Abs 10.6 (H) 1.7 - 7.7 K/uL   Lymphocytes Relative 13 %   Lymphs Abs 1.7 0.7 - 4.0 K/uL   Monocytes Relative 7 %   Monocytes Absolute 1.0 0.1 - 1.0 K/uL   Eosinophils Relative 0 %   Eosinophils Absolute 0.0 0.0 - 0.7 K/uL   Basophils Relative 0 %   Basophils Absolute 0.0 0.0 - 0.1 K/uL  CK     Status: Abnormal   Collection Time: 09/18/15  7:59 PM  Result Value Ref Range   Total CK 3284 (H) 49 - 397 U/L  Urine rapid drug screen (hosp performed)     Status: Abnormal   Collection Time: 09/18/15  8:48 PM  Result Value Ref Range   Opiates POSITIVE (A) NONE DETECTED   Cocaine POSITIVE (A) NONE DETECTED   Benzodiazepines POSITIVE (A) NONE DETECTED   Amphetamines POSITIVE (A) NONE DETECTED   Tetrahydrocannabinol NONE DETECTED NONE DETECTED   Barbiturates NONE DETECTED NONE DETECTED  Urinalysis, Routine w reflex microscopic      Status: Abnormal   Collection Time: 09/18/15  8:48 PM  Result Value Ref Range   Color, Urine AMBER (A) YELLOW   APPearance CLOUDY (A) CLEAR   Specific Gravity, Urine >1.030 (H) 1.005 - 1.030   pH 5.5 5.0 - 8.0   Glucose, UA 500 (A) NEGATIVE mg/dL   Hgb urine dipstick LARGE (A) NEGATIVE   Bilirubin Urine NEGATIVE NEGATIVE   Ketones, ur NEGATIVE NEGATIVE mg/dL   Protein, ur 696 (A) NEGATIVE mg/dL   Urobilinogen, UA 0.2 0.0 - 1.0 mg/dL   Nitrite NEGATIVE NEGATIVE   Leukocytes, UA NEGATIVE NEGATIVE  Urine microscopic-add on     Status: Abnormal   Collection Time: 09/18/15  8:48 PM  Result Value Ref Range   Squamous Epithelial / LPF FEW (A) RARE   WBC, UA 0-2 <3 WBC/hpf   RBC / HPF 7-10 <3 RBC/hpf   Bacteria, UA FEW (A) RARE   Casts HYALINE CASTS (A) NEGATIVE  I-Stat arterial blood gas, ED     Status: Abnormal   Collection Time: 09/18/15  8:52 PM  Result Value Ref Range   pH, Arterial 7.233 (L) 7.350 - 7.450   pCO2 arterial 58.5 (HH) 35.0 - 45.0 mmHg   pO2,  Arterial 377.0 (H) 80.0 - 100.0 mmHg   Bicarbonate 24.7 (H) 20.0 - 24.0 mEq/L   TCO2 26 0 - 100 mmol/L   O2 Saturation 100.0 %   Acid-base deficit 3.0 (H) 0.0 - 2.0 mmol/L   Patient temperature 98.6 F    Collection site RADIAL, ALLEN'S TEST ACCEPTABLE    Drawn by RT    Sample type ARTERIAL    Comment NOTIFIED PHYSICIAN   Ethanol     Status: None   Collection Time: 09/18/15 10:07 PM  Result Value Ref Range   Alcohol, Ethyl (B) <5 <5 mg/dL  Blood gas, arterial     Status: Abnormal   Collection Time: 09/18/15 10:10 PM  Result Value Ref Range   FIO2 0.40    Delivery systems VENTILATOR    Mode PRESSURE REGULATED VOLUME CONTROL    VT 580 mL   LHR 20 resp/min   Peep/cpap 5.0 cm H20   pH, Arterial 7.371 7.350 - 7.450   pCO2 arterial 37.4 35.0 - 45.0 mmHg   pO2, Arterial 74.8 (L) 80.0 - 100.0 mmHg   Bicarbonate 21.5 20.0 - 24.0 mEq/L   TCO2 22.7 0 - 100 mmol/L   Acid-base deficit 3.1 (H) 0.0 - 2.0 mmol/L   O2 Saturation  94.5 %   Patient temperature 96.8    Collection site BRACHIAL ARTERY    Drawn by [id number]    Sample type ARTERIAL DRAW    Allens test (pass/fail) PASS PASS  MRSA PCR Screening     Status: None   Collection Time: 09/18/15 10:29 PM  Result Value Ref Range   MRSA by PCR NEGATIVE NEGATIVE  Triglycerides     Status: Abnormal   Collection Time: 09/18/15 11:04 PM  Result Value Ref Range   Triglycerides 227 (H) <150 mg/dL  CBC     Status: Abnormal   Collection Time: 09/19/15  3:15 AM  Result Value Ref Range   WBC 12.6 (H) 4.0 - 10.5 K/uL   RBC 3.65 (L) 4.22 - 5.81 MIL/uL   Hemoglobin 11.6 (L) 13.0 - 17.0 g/dL   HCT 04.5 (L) 40.9 - 81.1 %   MCV 90.1 78.0 - 100.0 fL   MCH 31.8 26.0 - 34.0 pg   MCHC 35.3 30.0 - 36.0 g/dL   RDW 91.4 78.2 - 95.6 %   Platelets 206 150 - 400 K/uL  Basic metabolic panel     Status: Abnormal   Collection Time: 09/19/15  3:15 AM  Result Value Ref Range   Sodium 146 (H) 135 - 145 mmol/L   Potassium 4.8 3.5 - 5.1 mmol/L   Chloride 111 101 - 111 mmol/L   CO2 23 22 - 32 mmol/L   Glucose, Bld 89 65 - 99 mg/dL   BUN 24 (H) 6 - 20 mg/dL   Creatinine, Ser 2.13 0.61 - 1.24 mg/dL   Calcium 7.7 (L) 8.9 - 10.3 mg/dL   GFR calc non Af Amer >60 >60 mL/min   GFR calc Af Amer >60 >60 mL/min   Anion gap 12 5 - 15  Lactic acid, plasma     Status: Abnormal   Collection Time: 09/19/15  3:15 AM  Result Value Ref Range   Lactic Acid, Venous 5.2 (HH) 0.5 - 2.0 mmol/L     Assessment/Plan:   NEURO  Altered Mental Status:  sedation   Plan: Remove sedation and move towards extubation after repeat CT head if that does not show any significant worsening  PULM  Atelectasis/collapse (focal and bilateral  focal infiltrates)   Plan: CPM with treatment for of aspiration pneumonia  CARDIO  Sinus Tachycardia   Plan: CPM  RENAL  Adequate   Plan: No concerns currently  GI  No concerns   Plan: CPM  ID  Pneumonia (aspiration pneumonitis)   Plan: Being treated empirically with  Unasyn  HEME  Anemia acute blood loss anemia)   Plan: Moderated anemia that does not require transfusion  ENDO No known issues   Plan: CPM  Global Issues      LOS: 1 day   Additional comments:I reviewed the patient's new clinical lab test results. cbc/bmet and I reviewed the patients new imaging test results. cxr  Critical Care Total Time*: 30 Minutes  Marsia Cino 09/19/2015  *Care during the described time interval was provided by me and/or other providers on the critical care team.  I have reviewed this patient's available data, including medical history, events of note, physical examination and test results as part of my evaluation.

## 2015-09-19 NOTE — Progress Notes (Signed)
Pt. Was transported to CT & back to 3M06 without any complications.  

## 2015-09-20 LAB — CBC WITH DIFFERENTIAL/PLATELET
BASOS ABS: 0 10*3/uL (ref 0.0–0.1)
Basophils Relative: 0 %
Eosinophils Absolute: 0 10*3/uL (ref 0.0–0.7)
Eosinophils Relative: 0 %
HEMATOCRIT: 26.5 % — AB (ref 39.0–52.0)
HEMOGLOBIN: 8.7 g/dL — AB (ref 13.0–17.0)
LYMPHS PCT: 11 %
Lymphs Abs: 0.8 10*3/uL (ref 0.7–4.0)
MCH: 31 pg (ref 26.0–34.0)
MCHC: 32.8 g/dL (ref 30.0–36.0)
MCV: 94.3 fL (ref 78.0–100.0)
MONOS PCT: 4 %
Monocytes Absolute: 0.3 10*3/uL (ref 0.1–1.0)
NEUTROS ABS: 6 10*3/uL (ref 1.7–7.7)
NEUTROS PCT: 85 %
Platelets: 141 10*3/uL — ABNORMAL LOW (ref 150–400)
RBC: 2.81 MIL/uL — ABNORMAL LOW (ref 4.22–5.81)
RDW: 13.4 % (ref 11.5–15.5)
WBC: 7.1 10*3/uL (ref 4.0–10.5)

## 2015-09-20 LAB — BASIC METABOLIC PANEL
ANION GAP: 6 (ref 5–15)
BUN: 12 mg/dL (ref 6–20)
CO2: 24 mmol/L (ref 22–32)
Calcium: 7.3 mg/dL — ABNORMAL LOW (ref 8.9–10.3)
Chloride: 114 mmol/L — ABNORMAL HIGH (ref 101–111)
Creatinine, Ser: 0.85 mg/dL (ref 0.61–1.24)
GLUCOSE: 97 mg/dL (ref 65–99)
POTASSIUM: 4.4 mmol/L (ref 3.5–5.1)
Sodium: 144 mmol/L (ref 135–145)

## 2015-09-20 LAB — GLUCOSE, CAPILLARY
GLUCOSE-CAPILLARY: 78 mg/dL (ref 65–99)
Glucose-Capillary: 103 mg/dL — ABNORMAL HIGH (ref 65–99)

## 2015-09-20 MED ORDER — IOHEXOL 300 MG/ML  SOLN
100.0000 mL | Freq: Once | INTRAMUSCULAR | Status: AC | PRN
  Administered 2015-09-18: 100 mL via INTRAVENOUS

## 2015-09-20 MED ORDER — PIVOT 1.5 CAL PO LIQD
1000.0000 mL | ORAL | Status: DC
  Administered 2015-09-20 – 2015-09-21 (×2): 1000 mL
  Filled 2015-09-20 (×8): qty 1000

## 2015-09-20 MED ORDER — VITAL HIGH PROTEIN PO LIQD
1000.0000 mL | ORAL | Status: DC
  Filled 2015-09-20 (×2): qty 1000
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
## 2015-09-20 NOTE — Care Management (Signed)
Initial UR completed . Shontez Sermon RN BSN 
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
## 2015-09-20 NOTE — Progress Notes (Signed)
Pt is still intubated and sedated.  Lip laceration site is c/d/i. No nasal deformity.  Plan suture removal early next week.

## 2015-09-21 ENCOUNTER — Inpatient Hospital Stay (HOSPITAL_COMMUNITY): Payer: Self-pay

## 2015-09-21 LAB — BASIC METABOLIC PANEL
ANION GAP: 7 (ref 5–15)
BUN: 9 mg/dL (ref 6–20)
CALCIUM: 7.7 mg/dL — AB (ref 8.9–10.3)
CO2: 23 mmol/L (ref 22–32)
Chloride: 112 mmol/L — ABNORMAL HIGH (ref 101–111)
Creatinine, Ser: 0.63 mg/dL (ref 0.61–1.24)
GLUCOSE: 128 mg/dL — AB (ref 65–99)
POTASSIUM: 3.7 mmol/L (ref 3.5–5.1)
Sodium: 142 mmol/L (ref 135–145)

## 2015-09-21 LAB — CBC
HEMATOCRIT: 25.1 % — AB (ref 39.0–52.0)
Hemoglobin: 8.5 g/dL — ABNORMAL LOW (ref 13.0–17.0)
MCH: 31.6 pg (ref 26.0–34.0)
MCHC: 33.9 g/dL (ref 30.0–36.0)
MCV: 93.3 fL (ref 78.0–100.0)
PLATELETS: 142 10*3/uL — AB (ref 150–400)
RBC: 2.69 MIL/uL — AB (ref 4.22–5.81)
RDW: 13.4 % (ref 11.5–15.5)
WBC: 11.5 10*3/uL — AB (ref 4.0–10.5)

## 2015-09-21 LAB — GLUCOSE, CAPILLARY
GLUCOSE-CAPILLARY: 123 mg/dL — AB (ref 65–99)
GLUCOSE-CAPILLARY: 134 mg/dL — AB (ref 65–99)
GLUCOSE-CAPILLARY: 89 mg/dL (ref 65–99)
Glucose-Capillary: 100 mg/dL — ABNORMAL HIGH (ref 65–99)
Glucose-Capillary: 95 mg/dL (ref 65–99)
Glucose-Capillary: 98 mg/dL (ref 65–99)

## 2015-09-21 LAB — BLOOD PRODUCT ORDER (VERBAL) VERIFICATION

## 2015-09-21 NOTE — Procedures (Signed)
Extubation Procedure Note  Patient Details:   Name: Derek Hobbs DOB: [birth year] MRN: [id number]   Airway Documentation:  Airway 7.5 mm (Active)  Secured at (cm) 24 cm 09/21/2015  3:21 AM  Measured From Lips 09/21/2015  3:21 AM  Secured Location Right 09/21/2015  3:21 AM  Secured By [employer] 09/21/2015  3:21 AM  Tube Holder Repositioned Yes 09/21/2015  3:21 AM  Cuff Pressure (cm H2O) 24 cm H2O 09/21/2015  3:21 AM  Site Condition Dry 09/21/2015  3:21 AM    Evaluation  O2 sats: stable throughout Complications: No apparent complications Patient did tolerate procedure well. Bilateral Breath Sounds: Clear, Diminished Suctioning: Airway Yes   Patient was extubated to a 4L [place]. Cuff leak was heard. No stridor noted. RN was at bedside with RT. RT will continue to monitor patients progress.  Darolyn Rua 09/21/2015, 7:57 AM

## 2015-09-21 NOTE — Progress Notes (Signed)
Patient removed foley catheter. MD notified. Order to not replace foley at this time. Pt able to void. Will continue to monitor closely.
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
## 2015-09-22 ENCOUNTER — Inpatient Hospital Stay (HOSPITAL_COMMUNITY): Payer: Self-pay

## 2015-09-22 ENCOUNTER — Inpatient Hospital Stay (HOSPITAL_COMMUNITY): Payer: MEDICAID

## 2015-09-22 LAB — CBC WITH DIFFERENTIAL/PLATELET
BASOS ABS: 0 10*3/uL (ref 0.0–0.1)
Basophils Relative: 0 %
EOS ABS: 0.2 10*3/uL (ref 0.0–0.7)
EOS PCT: 2 %
HCT: 24.9 % — ABNORMAL LOW (ref 39.0–52.0)
Hemoglobin: 8.5 g/dL — ABNORMAL LOW (ref 13.0–17.0)
Lymphocytes Relative: 9 %
Lymphs Abs: 0.9 10*3/uL (ref 0.7–4.0)
MCH: 31.7 pg (ref 26.0–34.0)
MCHC: 34.1 g/dL (ref 30.0–36.0)
MCV: 92.9 fL (ref 78.0–100.0)
Monocytes Absolute: 0.9 10*3/uL (ref 0.1–1.0)
Monocytes Relative: 8 %
Neutro Abs: 8.9 10*3/uL — ABNORMAL HIGH (ref 1.7–7.7)
Neutrophils Relative %: 81 %
PLATELETS: 173 10*3/uL (ref 150–400)
RBC: 2.68 MIL/uL — AB (ref 4.22–5.81)
RDW: 13.1 % (ref 11.5–15.5)
WBC: 11 10*3/uL — AB (ref 4.0–10.5)

## 2015-09-22 LAB — GLUCOSE, CAPILLARY
GLUCOSE-CAPILLARY: 79 mg/dL (ref 65–99)
GLUCOSE-CAPILLARY: 90 mg/dL (ref 65–99)
GLUCOSE-CAPILLARY: 130 mg/dL — AB (ref 65–99)
Glucose-Capillary: 107 mg/dL — ABNORMAL HIGH (ref 65–99)
Glucose-Capillary: 94 mg/dL (ref 65–99)
Glucose-Capillary: 98 mg/dL (ref 65–99)

## 2015-09-22 LAB — BASIC METABOLIC PANEL
ANION GAP: 7 (ref 5–15)
BUN: 5 mg/dL — ABNORMAL LOW (ref 6–20)
CO2: 23 mmol/L (ref 22–32)
Calcium: 7.5 mg/dL — ABNORMAL LOW (ref 8.9–10.3)
Chloride: 106 mmol/L (ref 101–111)
Creatinine, Ser: 0.68 mg/dL (ref 0.61–1.24)
GFR calc Af Amer: >60 mL/min (ref >60)
Glucose, Bld: 101 mg/dL — ABNORMAL HIGH (ref 65–99)
POTASSIUM: 3.6 mmol/L (ref 3.5–5.1)
SODIUM: 136 mmol/L (ref 135–145)

## 2015-09-22 LAB — TRIGLYCERIDES: TRIGLYCERIDES: 147 mg/dL (ref <150)

## 2015-09-22 MED ORDER — LORAZEPAM 2 MG/ML IJ SOLN
1.0000 mg | Freq: Once | INTRAMUSCULAR | Status: AC
  Administered 2015-09-22: 1 mg via INTRAVENOUS

## 2015-09-22 MED ORDER — LORAZEPAM 2 MG/ML IJ SOLN
INTRAMUSCULAR | Status: AC
  Filled 2015-09-22: qty 1

## 2015-09-22 MED ORDER — CLONAZEPAM 0.5 MG PO TABS
0.5000 mg | ORAL_TABLET | Freq: Two times a day (BID) | ORAL | Status: DC
  Administered 2015-09-22 – 2015-09-28 (×13): 0.5 mg via ORAL
  Filled 2015-09-22 (×13): qty 1

## 2015-09-22 MED ORDER — OXYCODONE HCL 5 MG PO TABS
5.0000 mg | ORAL_TABLET | ORAL | Status: DC | PRN
  Administered 2015-09-22: 10 mg via ORAL
  Administered 2015-09-22 – 2015-09-23 (×2): 5 mg via ORAL
  Administered 2015-09-23 – 2015-09-24 (×2): 10 mg via ORAL
  Administered 2015-09-24: 15 mg via ORAL
  Administered 2015-09-24: 10 mg via ORAL
  Administered 2015-09-25 – 2015-09-26 (×4): 15 mg via ORAL
  Filled 2015-09-22: qty 2
  Filled 2015-09-22: qty 3
  Filled 2015-09-22: qty 1
  Filled 2015-09-22: qty 3
  Filled 2015-09-22: qty 2
  Filled 2015-09-22: qty 1
  Filled 2015-09-22: qty 2
  Filled 2015-09-22 (×2): qty 3
  Filled 2015-09-22: qty 2
  Filled 2015-09-22: qty 3

## 2015-09-22 MED ORDER — IPRATROPIUM-ALBUTEROL 0.5-2.5 (3) MG/3ML IN SOLN
3.0000 mL | Freq: Four times a day (QID) | RESPIRATORY_TRACT | Status: DC
  Administered 2015-09-22 (×3): 3 mL via RESPIRATORY_TRACT
  Filled 2015-09-22 (×3): qty 3

## 2015-09-22 MED ORDER — DEXMEDETOMIDINE HCL IN NACL 200 MCG/50ML IV SOLN
0.4000 ug/kg/h | INTRAVENOUS | Status: DC
  Administered 2015-09-22: 0.4 ug/kg/h via INTRAVENOUS
  Administered 2015-09-22 (×3): 0.6 ug/kg/h via INTRAVENOUS
  Administered 2015-09-23 (×2): 1.2 ug/kg/h via INTRAVENOUS
  Administered 2015-09-23: 1 ug/kg/h via INTRAVENOUS
  Administered 2015-09-23: 0.6 ug/kg/h via INTRAVENOUS
  Administered 2015-09-23: 1.2 ug/kg/h via INTRAVENOUS
  Administered 2015-09-24: 0.5 ug/kg/h via INTRAVENOUS
  Filled 2015-09-22: qty 50
  Filled 2015-09-22: qty 100
  Filled 2015-09-22 (×8): qty 50

## 2015-09-22 MED ORDER — FENTANYL 25 MCG/HR TD PT72
50.0000 ug | MEDICATED_PATCH | TRANSDERMAL | Status: DC
  Administered 2015-09-22 – 2015-09-25 (×2): 50 ug via TRANSDERMAL
  Filled 2015-09-22 (×2): qty 1

## 2015-09-22 MED ORDER — QUETIAPINE FUMARATE 50 MG PO TABS
50.0000 mg | ORAL_TABLET | Freq: Two times a day (BID) | ORAL | Status: DC
  Administered 2015-09-22 – 2015-09-26 (×10): 50 mg via ORAL
  Filled 2015-09-22 (×13): qty 1

## 2015-09-22 NOTE — Progress Notes (Signed)
Had been very agitated, verbally abusive, picking and pulling on contraptions and attempting to get OOB.Had been very tachyneic but SaO2 remained 95-100% on 6LPMNC. Remained on fentanyl gtt, dose adjusted, and other PRNs given but little help. Trauma made aware with order to start precedex gtt, and a 1x dose of ativan. Continuing to closely monitor.
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
## 2015-09-22 NOTE — Progress Notes (Signed)
160cc Fentanyl wasted in sink with Rulon Eisenmenger, RN.
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
## 2015-09-22 NOTE — Progress Notes (Signed)
Patient ID: Derek Hobbs, male   DOB: 1981/08/06, 34 y.o.   MRN: [id number] Neuro stable. Ct head in am

## 2015-09-23 ENCOUNTER — Inpatient Hospital Stay (HOSPITAL_COMMUNITY): Payer: Self-pay

## 2015-09-23 DIAGNOSIS — T1490 Injury, unspecified: Secondary | ICD-10-CM

## 2015-09-23 DIAGNOSIS — S069X4S Unspecified intracranial injury with loss of consciousness of 6 hours to 24 hours, sequela: Secondary | ICD-10-CM

## 2015-09-23 LAB — GLUCOSE, CAPILLARY
GLUCOSE-CAPILLARY: 110 mg/dL — AB (ref 65–99)
Glucose-Capillary: 109 mg/dL — ABNORMAL HIGH (ref 65–99)
Glucose-Capillary: 120 mg/dL — ABNORMAL HIGH (ref 65–99)
Glucose-Capillary: 131 mg/dL — ABNORMAL HIGH (ref 65–99)
Glucose-Capillary: 154 mg/dL — ABNORMAL HIGH (ref 65–99)
Glucose-Capillary: 165 mg/dL — ABNORMAL HIGH (ref 65–99)
Glucose-Capillary: 89 mg/dL (ref 65–99)

## 2015-09-23 LAB — CBC
HCT: 24.3 % — ABNORMAL LOW (ref 39.0–52.0)
HEMOGLOBIN: 8.4 g/dL — AB (ref 13.0–17.0)
MCH: 31.7 pg (ref 26.0–34.0)
MCHC: 34.6 g/dL (ref 30.0–36.0)
MCV: 91.7 fL (ref 78.0–100.0)
Platelets: 208 10*3/uL (ref 150–400)
RBC: 2.65 MIL/uL — AB (ref 4.22–5.81)
RDW: 13 % (ref 11.5–15.5)
WBC: 7.8 10*3/uL (ref 4.0–10.5)

## 2015-09-23 LAB — BASIC METABOLIC PANEL
Anion gap: 8 (ref 5–15)
BUN: 6 mg/dL (ref 6–20)
CHLORIDE: 108 mmol/L (ref 101–111)
CO2: 23 mmol/L (ref 22–32)
CREATININE: 0.72 mg/dL (ref 0.61–1.24)
Calcium: 7.7 mg/dL — ABNORMAL LOW (ref 8.9–10.3)
GFR calc Af Amer: >60 mL/min (ref >60)
GFR calc non Af Amer: >60 mL/min (ref >60)
GLUCOSE: 117 mg/dL — AB (ref 65–99)
POTASSIUM: 3.3 mmol/L — AB (ref 3.5–5.1)
SODIUM: 139 mmol/L (ref 135–145)

## 2015-09-23 MED ORDER — ENSURE ENLIVE PO LIQD
237.0000 mL | Freq: Two times a day (BID) | ORAL | Status: DC
  Administered 2015-09-23 – 2015-09-29 (×12): 237 mL via ORAL

## 2015-09-23 MED ORDER — IPRATROPIUM-ALBUTEROL 0.5-2.5 (3) MG/3ML IN SOLN
3.0000 mL | Freq: Four times a day (QID) | RESPIRATORY_TRACT | Status: DC
  Administered 2015-09-23 – 2015-09-25 (×10): 3 mL via RESPIRATORY_TRACT
  Filled 2015-09-23 (×10): qty 3

## 2015-09-23 MED ORDER — PANTOPRAZOLE SODIUM 40 MG PO TBEC
40.0000 mg | DELAYED_RELEASE_TABLET | Freq: Every day | ORAL | Status: DC
  Administered 2015-09-23 – 2015-09-25 (×3): 40 mg via ORAL
  Filled 2015-09-23 (×3): qty 1

## 2015-09-23 NOTE — Progress Notes (Signed)
Rehab admissions - Please see rehab consult done today.  I will follow up on Monday to determine social supports and most appropriate venue of care for rehab.  Call me for questions.  #[phone]
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
## 2015-09-23 NOTE — Progress Notes (Signed)
Rehab Admissions Coordinator Note:  Patient was screened by Trish Mage for appropriateness for an Inpatient Acute Rehab Consult.  At this time, we are recommending Inpatient Rehab consult.  Lelon Frohlich M 09/23/2015, 9:06 AM  I can be reached at [phone].
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
## 2015-09-23 NOTE — Care Management (Signed)
UR completed. Mays Paino RNBSN 
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
## 2015-09-23 NOTE — Progress Notes (Signed)
Pt got out of bed and fell. Reported left shoulder hurting and low back from fall.Dr Donell Beers oncall notified of pt complaints and MD ordered L spine and dg Left shoulder to assess for fractures/injury. Restraints obtained(posey belt).

## 2015-09-23 NOTE — Clinical Social Work Note (Signed)
CSW continues to follow patient for any CSW related needs. Report left for weekend.   Roddie Mc MSW, [place], [place], [phone]
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
## 2015-09-24 LAB — GLUCOSE, CAPILLARY
GLUCOSE-CAPILLARY: 199 mg/dL — AB (ref 65–99)
GLUCOSE-CAPILLARY: 123 mg/dL — AB (ref 65–99)
GLUCOSE-CAPILLARY: 129 mg/dL — AB (ref 65–99)
GLUCOSE-CAPILLARY: 145 mg/dL — AB (ref 65–99)
Glucose-Capillary: 258 mg/dL — ABNORMAL HIGH (ref 65–99)
Glucose-Capillary: 109 mg/dL — ABNORMAL HIGH (ref 65–99)
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
## 2015-09-25 LAB — GLUCOSE, CAPILLARY
GLUCOSE-CAPILLARY: 137 mg/dL — AB (ref 65–99)
GLUCOSE-CAPILLARY: 113 mg/dL — AB (ref 65–99)
Glucose-Capillary: 106 mg/dL — ABNORMAL HIGH (ref 65–99)

## 2015-09-25 LAB — BASIC METABOLIC PANEL
Anion gap: 11 (ref 5–15)
CHLORIDE: 108 mmol/L (ref 101–111)
CO2: 23 mmol/L (ref 22–32)
Calcium: 8.2 mg/dL — ABNORMAL LOW (ref 8.9–10.3)
Creatinine, Ser: 0.62 mg/dL (ref 0.61–1.24)
GFR calc Af Amer: >60 mL/min (ref >60)
GFR calc non Af Amer: >60 mL/min (ref >60)
GLUCOSE: 108 mg/dL — AB (ref 65–99)
POTASSIUM: 3.2 mmol/L — AB (ref 3.5–5.1)
SODIUM: 142 mmol/L (ref 135–145)

## 2015-09-25 MED ORDER — POTASSIUM CHLORIDE 20 MEQ PO PACK
20.0000 meq | PACK | Freq: Once | ORAL | Status: DC
  Filled 2015-09-25: qty 1

## 2015-09-25 MED ORDER — POTASSIUM CHLORIDE 20 MEQ/15ML (10%) PO SOLN: 20.0000 meq | Freq: Once | ORAL | Status: DC

## 2015-09-25 MED ORDER — BACITRACIN-NEOMYCIN-POLYMYXIN 400-5-5000 EX OINT
TOPICAL_OINTMENT | CUTANEOUS | Status: AC
  Administered 2015-09-25: 1
  Filled 2015-09-25: qty 1

## 2015-09-25 MED ORDER — IPRATROPIUM-ALBUTEROL 0.5-2.5 (3) MG/3ML IN SOLN: 3.0000 mL | RESPIRATORY_TRACT | Status: DC | PRN

## 2015-09-25 MED ORDER — PNEUMOCOCCAL VAC POLYVALENT 25 MCG/0.5ML IJ INJ
0.5000 mL | INJECTION | INTRAMUSCULAR | Status: DC
  Filled 2015-09-25: qty 0.5
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
## 2015-09-25 NOTE — Progress Notes (Signed)
Facial sutures removed. Lacerations healing well. No significant nasal deformity. No other intervention needed. Pt may f/u with me in clinic as needed.

## 2015-09-26 ENCOUNTER — Encounter (HOSPITAL_COMMUNITY): Payer: Self-pay | Admitting: Orthopedic Surgery

## 2015-09-26 MED ORDER — HYDROMORPHONE HCL 1 MG/ML IJ SOLN
0.5000 mg | INTRAMUSCULAR | Status: DC | PRN
  Administered 2015-09-26 – 2015-09-29 (×7): 0.5 mg via INTRAVENOUS
  Filled 2015-09-26 (×7): qty 1
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
## 2015-09-26 NOTE — Progress Notes (Signed)
Rehab admissions - I met with patient.  He tells me that he was living with a friend, Merrilee Seashore, but not sure if he can stay there now.  Girlfriend does not have a place for patient to stay per patient.  Patient tells me he cannot stay with his mom.  I will call his mom.  Patient got out of bed on his own, walked to the bathroom, urinated, returned to bed with no assistance.  He was steady with no loss of balance this am, but is impulsive.  I will follow progress for now.  Call me for questions.  #[phone]
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
## 2015-09-27 ENCOUNTER — Inpatient Hospital Stay (HOSPITAL_COMMUNITY): Payer: Self-pay

## 2015-09-27 DIAGNOSIS — S069X9A Unspecified intracranial injury with loss of consciousness of unspecified duration, initial encounter: Secondary | ICD-10-CM | POA: Diagnosis present

## 2015-09-27 DIAGNOSIS — T07XXXA Unspecified multiple injuries, initial encounter: Secondary | ICD-10-CM

## 2015-09-27 DIAGNOSIS — F1910 Other psychoactive substance abuse, uncomplicated: Secondary | ICD-10-CM | POA: Diagnosis present

## 2015-09-27 DIAGNOSIS — S069XAA Unspecified intracranial injury with loss of consciousness status unknown, initial encounter: Secondary | ICD-10-CM | POA: Diagnosis present

## 2015-09-27 DIAGNOSIS — J690 Pneumonitis due to inhalation of food and vomit: Secondary | ICD-10-CM | POA: Diagnosis present

## 2015-09-27 MED ORDER — OXYCODONE HCL 5 MG PO TABS
10.0000 mg | ORAL_TABLET | ORAL | Status: DC | PRN
  Administered 2015-09-27 – 2015-09-29 (×9): 20 mg via ORAL
  Filled 2015-09-27 (×9): qty 4

## 2015-09-27 MED ORDER — QUETIAPINE FUMARATE 25 MG PO TABS
25.0000 mg | ORAL_TABLET | Freq: Three times a day (TID) | ORAL | Status: DC
  Administered 2015-09-27 (×3): 25 mg via ORAL
  Filled 2015-09-27 (×6): qty 1

## 2015-09-27 MED ORDER — NICOTINE 21 MG/24HR TD PT24
21.0000 mg | MEDICATED_PATCH | Freq: Every day | TRANSDERMAL | Status: DC
  Administered 2015-09-27 – 2015-09-29 (×3): 21 mg via TRANSDERMAL
  Filled 2015-09-27 (×3): qty 1
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
## 2015-09-27 NOTE — Progress Notes (Signed)
Patient ID: Derek Hobbs, male   DOB: 1981/03/21, 34 y.o.   MRN: [id number] Stable, no weakness. Mentally getting better. To get ct head

## 2015-09-27 NOTE — Progress Notes (Signed)
Rehab admissions - I spoke with rehab team and with Dr. Riley Kill.  Patient is doing too well to meet criteria for acute inpatient rehab admission.  Recommend home with [facility] or SNF placement if no one available at the time of discharge.  #[phone]

## 2015-09-27 NOTE — Progress Notes (Signed)
SLP Cancellation Note  Patient Details Name: EZIAH NEGRO MRN: [id number] DOB: [birth year]   Cancelled treatment:       Reason Eval/Treat Not Completed: Fatigue/lethargy limiting ability to participate. Pt sleeping and per mom had been given medications to help him rest. Will return as able.   Maxcine Ham, M.A. CCC-SLP [phone]  Maxcine Ham 09/27/2015, 3:51 PM
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
## 2015-09-28 MED ORDER — QUETIAPINE FUMARATE 25 MG PO TABS
25.0000 mg | ORAL_TABLET | Freq: Every day | ORAL | Status: DC
  Filled 2015-09-28: qty 1

## 2015-09-28 MED ORDER — SILVER SULFADIAZINE 1 % EX CREA: TOPICAL_CREAM | Freq: Every day | CUTANEOUS | Status: AC

## 2015-09-28 MED ORDER — QUETIAPINE FUMARATE 25 MG PO TABS
25.0000 mg | ORAL_TABLET | Freq: Two times a day (BID) | ORAL | Status: DC
  Administered 2015-09-28: 25 mg via ORAL
  Filled 2015-09-28 (×2): qty 1

## 2015-09-28 MED ORDER — OXYCODONE HCL 10 MG PO TABS: 10.0000 mg | ORAL_TABLET | ORAL | Status: AC | PRN

## 2015-09-28 MED ORDER — SILVER SULFADIAZINE 1 % EX CREA
TOPICAL_CREAM | Freq: Every day | CUTANEOUS | Status: DC
  Administered 2015-09-29: 12:00:00 via TOPICAL
  Filled 2015-09-28: qty 85

## 2015-09-28 MED ORDER — CLONAZEPAM 0.5 MG PO TABS
0.5000 mg | ORAL_TABLET | Freq: Every day | ORAL | Status: DC | PRN
  Administered 2015-09-28: 0.5 mg via ORAL
  Filled 2015-09-28: qty 1

## 2015-09-28 NOTE — Progress Notes (Signed)
OT Cancellation Note  Patient Details Name: Derek Hobbs MRN: [id number] DOB: 10/17/1981   Cancelled Treatment:    Reason Eval/Treat Not Completed: Fatigue/lethargy limiting ability to participate. Pt asleep upon entering room. Max motivation given to participate in therapy. Pt falling asleep while talking to therapist, declined to participate in OT stating that he wants to rest right now. Will check back as time allows.  Gaye AlkenBailey A Kemya Shed M.S., OTR/L Pager: [phone]  09/28/2015, 2:04 PM

## 2015-09-28 NOTE — Progress Notes (Signed)
Patient ID: Derek CreamerChristopher J XXXMoore, male   DOB: 09/17/1981, 34 y.o.   MRN: [id number] Stable. ct head seen. If he goes home he needs to see me in 3 to 4 weeks
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
## 2015-09-28 NOTE — Clinical Social Work Note (Signed)
CSW has reviewed PT evaluations and patient is progressing well. At this time SNF does not appear to be an appropriate disposition for the patient as he doesn't appear to have a skilled need.   Roddie McBryant Yanil Dawe MSW, [place]Donnell[place]CSW, [place], [phone]

## 2015-09-28 NOTE — Progress Notes (Signed)
Per PA, pt will dc home tomorrow with a friend to provide supervision.  Pt will need medication assistance, as he has no insurance.  RN Case Manager will provide MATCH letter prior to dc.    Case Manager to follow up in AM.  Quintella BatonJulie W. Sindhu Nguyen, RN, BSN  Trauma/Neuro ICU Case Manager [phone]
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
## 2015-09-28 NOTE — Progress Notes (Signed)
Attempted to change dressing to R shoulder and R knee. Pt stated that it had already been done this morning and that I didn't need to change them again. Applied bactrim ointment to abrasions on face
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
## 2015-09-29 LAB — URINALYSIS, ROUTINE W REFLEX MICROSCOPIC
Bilirubin Urine: NEGATIVE
GLUCOSE, UA: NEGATIVE mg/dL
Ketones, ur: NEGATIVE mg/dL
Nitrite: NEGATIVE
Protein, ur: NEGATIVE mg/dL
SPECIFIC GRAVITY, URINE: 1.016 (ref 1.005–1.030)
Urobilinogen, UA: 0.2 mg/dL (ref 0.0–1.0)
pH: 6 (ref 5.0–8.0)

## 2015-09-29 LAB — URINE MICROSCOPIC-ADD ON

## 2015-09-29 MED ORDER — CIPROFLOXACIN HCL 500 MG PO TABS: 500.0000 mg | ORAL_TABLET | Freq: Two times a day (BID) | ORAL | Status: AC

## 2015-09-29 MED ORDER — ACETAMINOPHEN 325 MG PO TABS
650.0000 mg | ORAL_TABLET | Freq: Four times a day (QID) | ORAL | Status: DC | PRN
Start: 2015-09-29 — End: 2015-09-29
  Administered 2015-09-29 (×2): 650 mg via ORAL
  Filled 2015-09-29 (×3): qty 2

## 2015-09-29 NOTE — Clinical Social Work Note (Signed)
Clinical Social Worker continuing to follow patient and family for support and discharge planning needs.  CSW spoke at length with patient mother, grandmother and NP to clarify patient needs at discharge.  Patient plans to go home with patient grandmother at discharge.    Clinical Social Worker will sign off for now as social work intervention is no longer needed. Please consult us again if new need arises.   Macario GoldsJesse Thea Holshouser, KentuckyLCSW [phone]

## 2015-09-29 NOTE — Care Management Note (Addendum)
Case Management Note  Patient Details  Name: Derek Hobbs MRN: [id number] Date of Birth: 08/18/1981  Subjective/Objective:                    Action/Plan: Referral for Home health given to  [facility] , [facility] checking with their office to see if they can take referral due to social issues .  [facility] called back and they are unable to take referral due to social and safety  issues in the home . Emina PA with trauma aware . Patient aware .  Patient's mother Derek Hobbs [phone]   [address] . [place] [phone]  Expected Discharge Date:                  Expected Discharge Plan:  Home w Home Health Services  In-House Referral:     Discharge planning Services  CM Consult  Post Acute Care Choice:    Choice offered to:  Patient  DME Arranged:    DME Agency:     HH Arranged:  RN, PT, OT, Speech Therapy HH Agency:  [facility]  Status of Service:  In process, will continue to follow  Medicare Important Message Given:    Date Medicare IM Given:    Medicare IM give by:    Date Additional Medicare IM Given:    Additional Medicare Important Message give by:     If discussed at Long Length of Stay Meetings, dates discussed:    Additional Comments:  Kingsley PlanWile, Kyser Wandel Marie, RN 09/29/2015, 8:51 AM
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
## 2015-09-30 LAB — URINE CULTURE
CULTURE: NO GROWTH
SPECIAL REQUESTS: NORMAL
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
